# Patient Record
Sex: Female | Born: 1965
Health system: Southern US, Community
[De-identification: ages and names within clinical notes are randomized; demographics above are authoritative.]

## PROBLEM LIST (undated history)

## (undated) DIAGNOSIS — D071 Carcinoma in situ of vulva: Secondary | ICD-10-CM

## (undated) DIAGNOSIS — E7800 Pure hypercholesterolemia, unspecified: Secondary | ICD-10-CM

## (undated) DIAGNOSIS — J00 Acute nasopharyngitis [common cold]: Secondary | ICD-10-CM

## (undated) DIAGNOSIS — D649 Anemia, unspecified: Secondary | ICD-10-CM

## (undated) DIAGNOSIS — F411 Generalized anxiety disorder: Secondary | ICD-10-CM

## (undated) DIAGNOSIS — K219 Gastro-esophageal reflux disease without esophagitis: Secondary | ICD-10-CM

## (undated) DIAGNOSIS — L570 Actinic keratosis: Secondary | ICD-10-CM

## (undated) DIAGNOSIS — F419 Anxiety disorder, unspecified: Secondary | ICD-10-CM

## (undated) DIAGNOSIS — Z872 Personal history of diseases of the skin and subcutaneous tissue: Secondary | ICD-10-CM

## (undated) DIAGNOSIS — Z86018 Personal history of other benign neoplasm: Secondary | ICD-10-CM

## (undated) DIAGNOSIS — I10 Essential (primary) hypertension: Secondary | ICD-10-CM

## (undated) HISTORY — DX: Actinic keratosis: L57.0

## (undated) HISTORY — DX: Anemia, unspecified: D64.9

## (undated) HISTORY — DX: Gastro-esophageal reflux disease without esophagitis: K21.9

## (undated) HISTORY — DX: Essential (primary) hypertension: I10

## (undated) HISTORY — DX: Anxiety disorder, unspecified: F41.9

## (undated) HISTORY — DX: Carcinoma in situ of vulva: D07.1

---

## 2005-08-06 HISTORY — PX: LAPAROSCOPIC ASSISTED VAGINAL HYSTERECTOMY: SHX5398

## 2016-08-06 HISTORY — PX: INCISE AND DRAIN ABCESS: PRO64

## 2017-02-15 ENCOUNTER — Encounter: Payer: Self-pay | Admitting: Family Medicine

## 2017-02-15 ENCOUNTER — Ambulatory Visit (INDEPENDENT_AMBULATORY_CARE_PROVIDER_SITE_OTHER): Payer: BLUE CROSS/BLUE SHIELD | Admitting: Family Medicine

## 2017-02-15 VITALS — BP 180/108 | HR 61 | Resp 20 | Ht 68.0 in | Wt 181.0 lb

## 2017-02-15 DIAGNOSIS — N9089 Other specified noninflammatory disorders of vulva and perineum: Secondary | ICD-10-CM | POA: Diagnosis not present

## 2017-02-15 DIAGNOSIS — F411 Generalized anxiety disorder: Secondary | ICD-10-CM | POA: Insufficient documentation

## 2017-02-15 DIAGNOSIS — Z01411 Encounter for gynecological examination (general) (routine) with abnormal findings: Secondary | ICD-10-CM

## 2017-02-15 DIAGNOSIS — L732 Hidradenitis suppurativa: Secondary | ICD-10-CM | POA: Insufficient documentation

## 2017-02-15 DIAGNOSIS — Z01419 Encounter for gynecological examination (general) (routine) without abnormal findings: Secondary | ICD-10-CM | POA: Diagnosis not present

## 2017-02-15 DIAGNOSIS — D071 Carcinoma in situ of vulva: Secondary | ICD-10-CM | POA: Insufficient documentation

## 2017-02-15 DIAGNOSIS — K219 Gastro-esophageal reflux disease without esophagitis: Secondary | ICD-10-CM | POA: Insufficient documentation

## 2017-02-15 DIAGNOSIS — I10 Essential (primary) hypertension: Secondary | ICD-10-CM | POA: Insufficient documentation

## 2017-02-15 DIAGNOSIS — Z720 Tobacco use: Secondary | ICD-10-CM | POA: Insufficient documentation

## 2017-02-15 MED ORDER — BUPROPION HCL 75 MG PO TABS: 75.0000 mg | ORAL_TABLET | Freq: Two times a day (BID) | ORAL | 2 refills | Status: DC

## 2017-02-15 MED ORDER — HYDROCHLOROTHIAZIDE 25 MG PO TABS: 25.0000 mg | ORAL_TABLET | Freq: Every day | ORAL | 3 refills | Status: DC

## 2017-02-15 MED ORDER — CHLORHEXIDINE GLUCONATE 4 % EX LIQD: Freq: Every day | CUTANEOUS | 0 refills | Status: DC | PRN

## 2017-02-15 NOTE — Assessment & Plan Note (Signed)
S/p biopsy today--f/u and treat based on results

## 2017-02-15 NOTE — Assessment & Plan Note (Signed)
Begin Wellbutrin, usual side effects, treatment dosing and onset of action discussed at length.

## 2017-02-15 NOTE — Patient Instructions (Addendum)
Preventive Care 40-64 Years, Female Preventive care refers to lifestyle choices and visits with your health care provider that can promote health and wellness. What does preventive care include?  A yearly physical exam. This is also called an annual well check.  Dental exams once or twice a year.  Routine eye exams. Ask your health care provider how often you should have your eyes checked.  Personal lifestyle choices, including: ? Daily care of your teeth and gums. ? Regular physical activity. ? Eating a healthy diet. ? Avoiding tobacco and drug use. ? Limiting alcohol use. ? Practicing safe sex. ? Taking low-dose aspirin daily starting at age 58. ? Taking vitamin and mineral supplements as recommended by your health care provider. What happens during an annual well check? The services and screenings done by your health care provider during your annual well check will depend on your age, overall health, lifestyle risk factors, and family history of disease. Counseling Your health care provider may ask you questions about your:  Alcohol use.  Tobacco use.  Drug use.  Emotional well-being.  Home and relationship well-being.  Sexual activity.  Eating habits.  Work and work Statistician.  Method of birth control.  Menstrual cycle.  Pregnancy history.  Screening You may have the following tests or measurements:  Height, weight, and BMI.  Blood pressure.  Lipid and cholesterol levels. These may be checked every 5 years, or more frequently if you are over 81 years old.  Skin check.  Lung cancer screening. You may have this screening every year starting at age 78 if you have a 30-pack-year history of smoking and currently smoke or have quit within the past 15 years.  Fecal occult blood test (FOBT) of the stool. You may have this test every year starting at age 65.  Flexible sigmoidoscopy or colonoscopy. You may have a sigmoidoscopy every 5 years or a colonoscopy  every 10 years starting at age 30.  Hepatitis C blood test.  Hepatitis B blood test.  Sexually transmitted disease (STD) testing.  Diabetes screening. This is done by checking your blood sugar (glucose) after you have not eaten for a while (fasting). You may have this done every 1-3 years.  Mammogram. This may be done every 1-2 years. Talk to your health care provider about when you should start having regular mammograms. This may depend on whether you have a family history of breast cancer.  BRCA-related cancer screening. This may be done if you have a family history of breast, ovarian, tubal, or peritoneal cancers.  Pelvic exam and Pap test. This may be done every 3 years starting at age 80. Starting at age 36, this may be done every 5 years if you have a Pap test in combination with an HPV test.  Bone density scan. This is done to screen for osteoporosis. You may have this scan if you are at high risk for osteoporosis.  Discuss your test results, treatment options, and if necessary, the need for more tests with your health care provider. Vaccines Your health care provider may recommend certain vaccines, such as:  Influenza vaccine. This is recommended every year.  Tetanus, diphtheria, and acellular pertussis (Tdap, Td) vaccine. You may need a Td booster every 10 years.  Varicella vaccine. You may need this if you have not been vaccinated.  Zoster vaccine. You may need this after age 5.  Measles, mumps, and rubella (MMR) vaccine. You may need at least one dose of MMR if you were born in  1957 or later. You may also need a second dose.  Pneumococcal 13-valent conjugate (PCV13) vaccine. You may need this if you have certain conditions and were not previously vaccinated.  Pneumococcal polysaccharide (PPSV23) vaccine. You may need one or two doses if you smoke cigarettes or if you have certain conditions.  Meningococcal vaccine. You may need this if you have certain  conditions.  Hepatitis A vaccine. You may need this if you have certain conditions or if you travel or work in places where you may be exposed to hepatitis A.  Hepatitis B vaccine. You may need this if you have certain conditions or if you travel or work in places where you may be exposed to hepatitis B.  Haemophilus influenzae type b (Hib) vaccine. You may need this if you have certain conditions.  Talk to your health care provider about which screenings and vaccines you need and how often you need them. This information is not intended to replace advice given to you by your health care provider. Make sure you discuss any questions you have with your health care provider. Document Released: 08/19/2015 Document Revised: 04/11/2016 Document Reviewed: 05/24/2015 Elsevier Interactive Patient Education  2017 [employer]. Hidradenitis Suppurativa Hidradenitis suppurativa is a long-term (chronic) skin disease that starts with blocked sweat glands or hair follicles. Bacteria may grow in these blocked openings of your skin. Hidradenitis suppurativa is like a severe form of acne that develops in areas of your body where acne would be unusual. It is most likely to affect the areas of your body where skin rubs against skin and becomes moist. This includes your:  Underarms.  Groin.  Genital areas.  Buttocks.  Upper thighs.  Breasts.  Hidradenitis suppurativa may start out with small pimples. The pimples can develop into deep sores that break open (rupture) and drain pus. Over time your skin may thicken and become scarred. Hidradenitis suppurativa cannot be passed from person to person. What are the causes? The exact cause of hidradenitis suppurativa is not known. This condition may be due to:  Female and female hormones. The condition is rare before and after puberty.  An overactive body defense system (immune system). Your immune system may overreact to the blocked hair follicles or sweat  glands and cause swelling and pus-filled sores.  What increases the risk? You may have a higher risk of hidradenitis suppurativa if you:  Are a woman.  Are between ages 74 and 21.  Have a family history of hidradenitis suppurativa.  Have a personal history of acne.  Are overweight.  Smoke.  Take the drug lithium.  What are the signs or symptoms? The first signs of an outbreak are usually painful skin bumps that look like pimples. As the condition progresses:  Skin bumps may get bigger and grow deeper into the skin.  Bumps under the skin may rupture and drain smelly pus.  Skin may become itchy and infected.  Skin may thicken and scar.  Drainage may continue through tunnels under the skin (fistulas).  Walking and moving your arms can become painful.  How is this diagnosed? Your health care provider may diagnose hidradenitis suppurativa based on your medical history and your signs and symptoms. A physical exam will also be done. You may need to see a health care provider who specializes in skin diseases (dermatologist). You may also have tests done to confirm the diagnosis. These can include:  Swabbing a sample of pus or drainage from your skin so it can be sent to  the lab and tested for infection.  Blood tests to check for infection.  How is this treated? The same treatment will not work for everybody with hidradenitis suppurativa. Your treatment will depend on how severe your symptoms are. You may need to try several treatments to find what works best for you. Part of your treatment may include cleaning and bandaging (dressing) your wounds. You may also have to take medicines, such as the following:  Antibiotics.  Acne medicines.  Medicines to block or suppress the immune system.  A diabetes medicine (metformin) is sometimes used to treat this condition.  For women, birth control pills can sometimes help relieve symptoms.  You may need surgery if you have a severe  case of hidradenitis suppurativa that does not respond to medicine. Surgery may involve:  Using a laser to clear the skin and remove hair follicles.  Opening and draining deep sores.  Removing the areas of skin that are diseased and scarred.  Follow these instructions at home:  Learn as much as you can about your disease, and work closely with your health care providers.  Take medicines only as directed by your health care provider.  If you were prescribed an antibiotic medicine, finish it all even if you start to feel better.  If you are overweight, losing weight may be very helpful. Try to reach and maintain a healthy weight.  Do not use any tobacco products, including cigarettes, chewing tobacco, or electronic cigarettes. If you need help quitting, ask your health care provider.  Do not shave the areas where you get hidradenitis suppurativa.  Do not wear deodorant.  Wear loose-fitting clothes.  Try not to overheat and get sweaty.  Take a daily bleach bath as directed by your health care provider. ? Fill your bathtub halfway with water. ? Pour in  cup of unscented household bleach. ? Soak for 5-10 minutes.  Cover sore areas with a warm, clean washcloth (compress) for 5-10 minutes. Contact a health care provider if:  You have a flare-up of hidradenitis suppurativa.  You have chills or a fever.  You are having trouble controlling your symptoms at home. This information is not intended to replace advice given to you by your health care provider. Make sure you discuss any questions you have with your health care provider. Document Released: 03/06/2004 Document Revised: 12/29/2015 Document Reviewed: 10/23/2013 Elsevier Interactive Patient Education  2018 [employer].  Steps to Quit Smoking Smoking tobacco can be bad for your health. It can also affect almost every organ in your body. Smoking puts you and people around you at risk for many serious long-lasting (chronic)  diseases. Quitting smoking is hard, but it is one of the best things that you can do for your health. It is never too late to quit. What are the benefits of quitting smoking? When you quit smoking, you lower your risk for getting serious diseases and conditions. They can include:  Lung cancer or lung disease.  Heart disease.  Stroke.  Heart attack.  Not being able to have children (infertility).  Weak bones (osteoporosis) and broken bones (fractures).  If you have coughing, wheezing, and shortness of breath, those symptoms may get better when you quit. You may also get sick less often. If you are pregnant, quitting smoking can help to lower your chances of having a baby of low birth weight. What can I do to help me quit smoking? Talk with your doctor about what can help you quit smoking. Some things  you can do (strategies) include:  Quitting smoking totally, instead of slowly cutting back how much you smoke over a period of time.  Going to in-person counseling. You are more likely to quit if you go to many counseling sessions.  Using resources and support systems, such as: ? Database administrator with a Social worker. ? Phone quitlines. ? Careers information officer. ? Support groups or group counseling. ? Text messaging programs. ? Mobile phone apps or applications.  Taking medicines. Some of these medicines may have nicotine in them. If you are pregnant or breastfeeding, do not take any medicines to quit smoking unless your doctor says it is okay. Talk with your doctor about counseling or other things that can help you.  Talk with your doctor about using more than one strategy at the same time, such as taking medicines while you are also going to in-person counseling. This can help make quitting easier. What things can I do to make it easier to quit? Quitting smoking might feel very hard at first, but there is a lot that you can do to make it easier. Take these steps:  Talk to your family  and friends. Ask them to support and encourage you.  Call phone quitlines, reach out to support groups, or work with a Social worker.  Ask people who smoke to not smoke around you.  Avoid places that make you want (trigger) to smoke, such as: ? Bars. ? Parties. ? Smoke-break areas at work.  Spend time with people who do not smoke.  Lower the stress in your life. Stress can make you want to smoke. Try these things to help your stress: ? Getting regular exercise. ? Deep-breathing exercises. ? Yoga. ? Meditating. ? Doing a body scan. To do this, close your eyes, focus on one area of your body at a time from head to toe, and notice which parts of your body are tense. Try to relax the muscles in those areas.  Download or buy apps on your mobile phone or tablet that can help you stick to your quit plan. There are many free apps, such as QuitGuide from the [employer] Office manager for Disease Control and Prevention). You can find more support from smokefree.gov and other websites.  This information is not intended to replace advice given to you by your health care provider. Make sure you discuss any questions you have with your health care provider. Document Released: 05/19/2009 Document Revised: 03/20/2016 Document Reviewed: 12/07/2014 Elsevier Interactive Patient Education  2018 [place] DASH stands for "Dietary Approaches to Stop Hypertension." The DASH eating plan is a healthy eating plan that has been shown to reduce high blood pressure (hypertension). It may also reduce your risk for type 2 diabetes, heart disease, and stroke. The DASH eating plan may also help with weight loss. What are tips for following this plan? General guidelines Avoid eating more than 2,300 mg (milligrams) of salt (sodium) a day. If you have hypertension, you may need to reduce your sodium intake to 1,500 mg a day. Limit alcohol intake to no more than 1 drink a day for nonpregnant women and 2 drinks a day  for men. One drink equals 12 oz of beer, 5 oz of wine, or 1 oz of hard liquor. Work with your health care provider to maintain a healthy body weight or to lose weight. Ask what an ideal weight is for you. Get at least 30 minutes of exercise that causes your heart to beat faster (aerobic  exercise) most days of the week. Activities may include walking, swimming, or biking. Work with your health care provider or diet and nutrition specialist (dietitian) to adjust your eating plan to your individual calorie needs. Reading food labels Check food labels for the amount of sodium per serving. Choose foods with less than 5 percent of the Daily Value of sodium. Generally, foods with less than 300 mg of sodium per serving fit into this eating plan. To find whole grains, look for the word "whole" as the first word in the ingredient list. Shopping Buy products labeled as "low-sodium" or "no salt added." Buy fresh foods. Avoid canned foods and premade or frozen meals. Cooking Avoid adding salt when cooking. Use salt-free seasonings or herbs instead of table salt or sea salt. Check with your health care provider or pharmacist before using salt substitutes. Do not fry foods. Cook foods using healthy methods such as baking, boiling, grilling, and broiling instead. Cook with heart-healthy oils, such as olive, canola, soybean, or sunflower oil. Meal planning  Eat a balanced diet that includes: 5 or more servings of fruits and vegetables each day. At each meal, try to fill half of your plate with fruits and vegetables. Up to 6-8 servings of whole grains each day. Less than 6 oz of lean meat, poultry, or fish each day. A 3-oz serving of meat is about the same size as a deck of cards. One egg equals 1 oz. 2 servings of low-fat dairy each day. A serving of nuts, seeds, or beans 5 times each week. Heart-healthy fats. Healthy fats called Omega-3 fatty acids are found in foods such as flaxseeds and coldwater fish, like  sardines, salmon, and mackerel. Limit how much you eat of the following: Canned or prepackaged foods. Food that is high in trans fat, such as fried foods. Food that is high in saturated fat, such as fatty meat. Sweets, desserts, sugary drinks, and other foods with added sugar. Full-fat dairy products. Do not salt foods before eating. Try to eat at least 2 vegetarian meals each week. Eat more home-cooked food and less restaurant, buffet, and fast food. When eating at a restaurant, ask that your food be prepared with less salt or no salt, if possible. What foods are recommended? The items listed may not be a complete list. Talk with your dietitian about what dietary choices are best for you. Grains Whole-grain or whole-wheat bread. Whole-grain or whole-wheat pasta. Brown rice. [place]. Bulgur. Whole-grain and low-sodium cereals. Pita bread. Low-fat, low-sodium crackers. Whole-wheat flour tortillas. Vegetables Fresh or frozen vegetables (raw, steamed, roasted, or grilled). Low-sodium or reduced-sodium tomato and vegetable juice. Low-sodium or reduced-sodium tomato sauce and tomato paste. Low-sodium or reduced-sodium canned vegetables. Fruits All fresh, dried, or frozen fruit. Canned fruit in natural juice (without added sugar). Meat and other protein foods Skinless chicken or [place]. Ground chicken or [place]. Pork with fat trimmed off. Fish and seafood. Egg whites. Dried beans, peas, or lentils. Unsalted nuts, nut butters, and seeds. Unsalted canned beans. Lean cuts of beef with fat trimmed off. Low-sodium, lean deli meat. Dairy Low-fat (1%) or fat-free (skim) milk. Fat-free, low-fat, or reduced-fat cheeses. Nonfat, low-sodium ricotta or cottage cheese. Low-fat or nonfat yogurt. Low-fat, low-sodium cheese. Fats and oils Soft margarine without trans fats. Vegetable oil. Low-fat, reduced-fat, or light mayonnaise and salad dressings (reduced-sodium). Canola, safflower, olive, soybean, and  sunflower oils. Avocado. Seasoning and other foods Herbs. Spices. Seasoning mixes without salt. Unsalted popcorn and pretzels. Fat-free sweets. What foods are  not recommended? The items listed may not be a complete list. Talk with your dietitian about what dietary choices are best for you. Grains Baked goods made with fat, such as croissants, muffins, or some breads. Dry pasta or rice meal packs. Vegetables Creamed or fried vegetables. Vegetables in a cheese sauce. Regular canned vegetables (not low-sodium or reduced-sodium). Regular canned tomato sauce and paste (not low-sodium or reduced-sodium). Regular tomato and vegetable juice (not low-sodium or reduced-sodium). Angie Fava. Olives. Fruits Canned fruit in a light or heavy syrup. Fried fruit. Fruit in cream or butter sauce. Meat and other protein foods Fatty cuts of meat. Ribs. Fried meat. Berniece Salines. Sausage. Bologna and other processed lunch meats. Salami. Fatback. Hotdogs. Bratwurst. Salted nuts and seeds. Canned beans with added salt. Canned or smoked fish. Whole eggs or egg yolks. Chicken or [place] with skin. Dairy Whole or 2% milk, cream, and half-and-half. Whole or full-fat cream cheese. Whole-fat or sweetened yogurt. Full-fat cheese. Nondairy creamers. Whipped toppings. Processed cheese and cheese spreads. Fats and oils Butter. Stick margarine. Lard. Shortening. Ghee. Bacon fat. Tropical oils, such as coconut, palm kernel, or palm oil. Seasoning and other foods Salted popcorn and pretzels. Onion salt, garlic salt, seasoned salt, table salt, and sea salt. Worcestershire sauce. Tartar sauce. Barbecue sauce. Teriyaki sauce. Soy sauce, including reduced-sodium. Steak sauce. Canned and packaged gravies. Fish sauce. Oyster sauce. Cocktail sauce. Horseradish that you find on the shelf. Ketchup. Mustard. Meat flavorings and tenderizers. Bouillon cubes. Hot sauce and Tabasco sauce. Premade or packaged marinades. Premade or packaged taco seasonings.  Relishes. Regular salad dressings. Where to find more information: National Heart, Lung, and [place]: https://wilson-eaton.com/ American Heart Association: www.heart.org Summary The DASH eating plan is a healthy eating plan that has been shown to reduce high blood pressure (hypertension). It may also reduce your risk for type 2 diabetes, heart disease, and stroke. With the DASH eating plan, you should limit salt (sodium) intake to 2,300 mg a day. If you have hypertension, you may need to reduce your sodium intake to 1,500 mg a day. When on the DASH eating plan, aim to eat more fresh fruits and vegetables, whole grains, lean proteins, low-fat dairy, and heart-healthy fats. Work with your health care provider or diet and nutrition specialist (dietitian) to adjust your eating plan to your individual calorie needs. This information is not intended to replace advice given to you by your health care provider. Make sure you discuss any questions you have with your health care provider. Document Released: 07/12/2011 Document Revised: 07/16/2016 Document Reviewed: 07/16/2016 Elsevier Interactive Patient Education  2017 [employer].

## 2017-02-15 NOTE — Assessment & Plan Note (Addendum)
Given how high her BP is will add HCTZ--see PCP--dash diet given

## 2017-02-15 NOTE — Assessment & Plan Note (Signed)
Associated with a lot of bloating--has seen GI in past, but suspect this will need further w/u and needs screening colonoscopy

## 2017-02-15 NOTE — Assessment & Plan Note (Addendum)
Given Rx for hibiclens to use weekly-->daily to try to decrease outbreaks, stop smoking, lose weight.

## 2017-02-15 NOTE — Progress Notes (Signed)
Pt here today c/o vaginal itching and discomfort, has a history of suppurative hidradenitis.  Also c/o increased weight gain, hot flashes, sleep disturbances, and abdominal bloating.

## 2017-02-15 NOTE — Assessment & Plan Note (Signed)
Given Wellbutrin and discussed briefly smoking and impact on health, BP, and hidradenitis

## 2017-02-15 NOTE — Progress Notes (Signed)
Subjective:     Beth Green is a 51 y.o. female and is here for a comprehensive physical exam. The patient reports problems - multiple. Feels bloated all the time. Has GERD and has Zantac. Tried prilosec which helped the GERD but not the bloating. Notes some weight gain. 35 lbs in last four years. Has hot flashes in last 2-3 years. Insomnia -- takes benadryl type med and sleeps x 4 hours. Then has difficulty falling asleep if takes nothing. Also has trouble turning off thoughts if awakens. Has hot flashes at night, some vaginal irritation which keeps her up. She has h/o hidradenitis and never has been treated. Has some white itchy patches that wakes her and is extremely itchy. Tried vagisil which made it worse. Used petroleum jelly and witch hazel.   Social History   Social History  . Marital status: Married    Spouse name: N/A  . Number of children: N/A  . Years of education: N/A   Occupational History  . Not on file.   Social History Main Topics  . Smoking status: Current Every Day Smoker    Packs/day: 0.25    Types: Cigarettes  . Smokeless tobacco: Never Used  . Alcohol use 6.0 oz/week    10 Glasses of wine per week  . Drug use: Unknown  . Sexual activity: Yes    Birth control/ protection: Surgical     Comment: Hysterectomy   Other Topics Concern  . Not on file   Social History Narrative  . No narrative on file   Health Maintenance  Topic Date Due  . HIV Screening  10/06/1980  . TETANUS/TDAP  10/06/1984  . PAP SMEAR  10/07/1986  . MAMMOGRAM  10/07/2015  . COLONOSCOPY  10/07/2015  . INFLUENZA VACCINE  03/06/2017    The following portions of the patient's history were reviewed and updated as appropriate: allergies, current medications, past family history, past medical history, past social history, past surgical history and problem list.  Review of Systems Pertinent items noted in HPI and remainder of comprehensive ROS otherwise negative.   Objective:    BP (!)  180/108 Comment: recheck left arm  Pulse 61   Resp 20   Ht 5\' 8"  (1.727 m)   Wt 181 lb (82.1 kg)   BMI 27.52 kg/m  General appearance: alert, cooperative and appears stated age Head: Normocephalic, without obvious abnormality, atraumatic Neck: no adenopathy, supple, symmetrical, trachea midline and thyroid not enlarged, symmetric, no tenderness/mass/nodules Lungs: clear to auscultation bilaterally Breasts: normal appearance, no masses or tenderness Heart: regular rate and rhythm, S1, S2 normal, no murmur, click, rub or gallop Abdomen: soft, non-tender; bowel sounds normal; no masses,  no organomegaly Pelvic: cervix normal in appearance, no adnexal masses or tenderness, no cervical motion tenderness, uterus normal size, shape, and consistency, vagina normal without discharge and mulitple boils in various stages noted. right labia minora with pink/white plaque noted Extremities: extremities normal, atraumatic, no cyanosis or edema Pulses: 2+ and symmetric Skin: Skin color, texture, turgor normal. No rashes or lesions Lymph nodes: Cervical, supraclavicular, and axillary nodes normal. Neurologic: Grossly normal    Procedure: Patient identified, informed consent signed, copy in chart, time out performed.   Area cleansed with Alcohol.  Injected with 1% Lidocaine with Epi.  2 mL. Area cleaned with Betadine and 4 mm punch biopsy performed without difficulty.  Hemostasis obtained with Silver Nitrate.  Assessment/Plan:   Problem List Items Addressed This Visit      Unprioritized   Suppurative  hidradenitis    Given Rx for hibiclens to use weekly-->daily to try to decrease outbreaks, stop smoking, lose weight.      Relevant Medications   chlorhexidine (HIBICLENS) 4 % external liquid   GERD (gastroesophageal reflux disease)    Associated with a lot of bloating--has seen GI in past, but suspect this will need further w/u and needs screening colonoscopy      Relevant Medications    ranitidine (ZANTAC) 150 MG tablet   Other Relevant Orders   Ambulatory referral to Gastroenterology   Tobacco abuse    Given Wellbutrin and discussed briefly smoking and impact on health, BP, and hidradenitis      Generalized anxiety disorder    Begin Wellbutrin, usual side effects, treatment dosing and onset of action discussed at length.      Relevant Medications   buPROPion (WELLBUTRIN) 75 MG tablet   Benign essential hypertension    Given how high her BP is will add HCTZ--see PCP--dash diet given      Relevant Medications   hydrochlorothiazide (HYDRODIURIL) 25 MG tablet   Other Relevant Orders   Ambulatory referral to Internal Medicine   Vulvar irritation    S/p biopsy today--f/u and treat based on results      Relevant Orders   Surgical pathology    Other Visit Diagnoses    Encounter for gynecological examination with abnormal finding    -  Primary   Relevant Orders   MM DIGITAL SCREENING BILATERAL   CBC   TSH   Comprehensive metabolic panel   Lipid panel        See After Visit Summary for Counseling Recommendations

## 2017-02-16 LAB — COMPREHENSIVE METABOLIC PANEL
ALT: 24 IU/L (ref 0–32)
AST: 19 IU/L (ref 0–40)
Albumin/Globulin Ratio: 2 (ref 1.2–2.2)
Albumin: 4.7 g/dL (ref 3.5–5.5)
Alkaline Phosphatase: 81 IU/L (ref 39–117)
BUN/Creatinine Ratio: 17 (ref 9–23)
BUN: 10 mg/dL (ref 6–24)
Bilirubin Total: 0.4 mg/dL (ref 0.0–1.2)
CO2: 22 mmol/L (ref 20–29)
Calcium: 9.7 mg/dL (ref 8.7–10.2)
Chloride: 103 mmol/L (ref 96–106)
Creatinine, Ser: 0.6 mg/dL (ref 0.57–1.00)
GFR calc Af Amer: 122 mL/min/{1.73_m2} (ref >59)
GFR calc non Af Amer: 106 mL/min/{1.73_m2} (ref >59)
Globulin, Total: 2.3 g/dL (ref 1.5–4.5)
Glucose: 93 mg/dL (ref 65–99)
Potassium: 4.4 mmol/L (ref 3.5–5.2)
Sodium: 144 mmol/L (ref 134–144)
Total Protein: 7 g/dL (ref 6.0–8.5)

## 2017-02-16 LAB — LIPID PANEL
Chol/HDL Ratio: 4.9 ratio — ABNORMAL HIGH (ref 0.0–4.4)
Cholesterol, Total: 258 mg/dL — ABNORMAL HIGH (ref 100–199)
HDL: 53 mg/dL (ref >39)
LDL Calculated: 141 mg/dL — ABNORMAL HIGH (ref 0–99)
Triglycerides: 320 mg/dL — ABNORMAL HIGH (ref 0–149)
VLDL Cholesterol Cal: 64 mg/dL — ABNORMAL HIGH (ref 5–40)

## 2017-02-16 LAB — CBC
Hematocrit: 44 % (ref 34.0–46.6)
Hemoglobin: 14.8 g/dL (ref 11.1–15.9)
MCH: 30.8 pg (ref 26.6–33.0)
MCHC: 33.6 g/dL (ref 31.5–35.7)
MCV: 92 fL (ref 79–97)
Platelets: 153 10*3/uL (ref 150–379)
RBC: 4.81 x10E6/uL (ref 3.77–5.28)
RDW: 13.2 % (ref 12.3–15.4)
WBC: 7.6 10*3/uL (ref 3.4–10.8)

## 2017-02-16 LAB — TSH: TSH: 0.962 u[IU]/mL (ref 0.450–4.500)

## 2017-02-20 ENCOUNTER — Encounter: Payer: Self-pay | Admitting: Gastroenterology

## 2017-03-08 ENCOUNTER — Encounter: Payer: Self-pay | Admitting: Family Medicine

## 2017-03-08 ENCOUNTER — Ambulatory Visit (INDEPENDENT_AMBULATORY_CARE_PROVIDER_SITE_OTHER): Payer: BLUE CROSS/BLUE SHIELD | Admitting: Family Medicine

## 2017-03-08 DIAGNOSIS — D071 Carcinoma in situ of vulva: Secondary | ICD-10-CM

## 2017-03-08 NOTE — Assessment & Plan Note (Addendum)
Discussed implications of this. Treatment needed with Wide local excision--Risks include but are not limited to bleeding, infection, injury to surrounding structures..  Likelihood of success is high.

## 2017-03-08 NOTE — Progress Notes (Signed)
   Subjective:    Patient ID: Beth Green is a 51 y.o. female presenting with Follow-up  on 03/08/2017  HPI: Here today for f/u vulvar biopsy. Results reveal VIN3.   Review of Systems  Constitutional: Negative for chills and fever.  Respiratory: Negative for shortness of breath.   Cardiovascular: Negative for chest pain.  Gastrointestinal: Negative for abdominal pain, nausea and vomiting.  Genitourinary: Positive for genital sores (vulvar itching). Negative for dysuria.  Skin: Negative for rash.      Objective:    BP (!) 153/91   Pulse 91   Wt 180 lb (81.6 kg)   BMI 27.37 kg/m  Physical Exam  Constitutional: She is oriented to person, place, and time. She appears well-developed and well-nourished. No distress.  HENT:  Head: Normocephalic and atraumatic.  Eyes: No scleral icterus.  Neck: Neck supple.  Cardiovascular: Normal rate.   Pulmonary/Chest: Effort normal.  Abdominal: Soft.  Neurological: She is alert and oriented to person, place, and time.  Skin: Skin is warm and dry.  Psychiatric: She has a normal mood and affect.        Assessment & Plan:   Problem List Items Addressed This Visit      Unprioritized   Vulvar intraepithelial neoplasia (VIN) grade 3    Discussed implications of this. Treatment needed with Wide local excision--Risks include but are not limited to bleeding, infection, injury to surrounding structures..  Likelihood of success is high.          Total face-to-face time with patient: 25 minutes. Over 50% of encounter was spent on counseling and coordination of care. Return in about 3 months (around 06/08/2017) for postop check.  Donnamae Jude 03/08/2017 10:14 AM

## 2017-03-11 ENCOUNTER — Other Ambulatory Visit: Payer: Self-pay | Admitting: *Deleted

## 2017-03-11 ENCOUNTER — Encounter (HOSPITAL_COMMUNITY): Payer: Self-pay

## 2017-03-11 ENCOUNTER — Encounter: Payer: Self-pay | Admitting: *Deleted

## 2017-03-11 DIAGNOSIS — Z01411 Encounter for gynecological examination (general) (routine) with abnormal findings: Secondary | ICD-10-CM

## 2017-03-19 ENCOUNTER — Ambulatory Visit (INDEPENDENT_AMBULATORY_CARE_PROVIDER_SITE_OTHER)
Admission: RE | Admit: 2017-03-19 | Discharge: 2017-03-19 | Disposition: A | Discharge status: Home / Self Care | Payer: BLUE CROSS/BLUE SHIELD | Source: Ambulatory Visit | Attending: Internal Medicine | Admitting: Internal Medicine

## 2017-03-19 ENCOUNTER — Encounter: Payer: Self-pay | Admitting: Internal Medicine

## 2017-03-19 ENCOUNTER — Ambulatory Visit (INDEPENDENT_AMBULATORY_CARE_PROVIDER_SITE_OTHER): Payer: BLUE CROSS/BLUE SHIELD | Admitting: Internal Medicine

## 2017-03-19 VITALS — BP 142/98 | HR 81 | Temp 98.1°F | Ht 68.0 in | Wt 181.5 lb

## 2017-03-19 DIAGNOSIS — W0110XA Fall on same level from slipping, tripping and stumbling with subsequent striking against unspecified object, initial encounter: Secondary | ICD-10-CM

## 2017-03-19 DIAGNOSIS — M79642 Pain in left hand: Secondary | ICD-10-CM | POA: Diagnosis not present

## 2017-03-19 DIAGNOSIS — K219 Gastro-esophageal reflux disease without esophagitis: Secondary | ICD-10-CM | POA: Diagnosis not present

## 2017-03-19 DIAGNOSIS — D071 Carcinoma in situ of vulva: Secondary | ICD-10-CM

## 2017-03-19 DIAGNOSIS — F411 Generalized anxiety disorder: Secondary | ICD-10-CM | POA: Diagnosis not present

## 2017-03-19 DIAGNOSIS — M25532 Pain in left wrist: Secondary | ICD-10-CM

## 2017-03-19 DIAGNOSIS — I10 Essential (primary) hypertension: Secondary | ICD-10-CM | POA: Diagnosis not present

## 2017-03-19 MED ORDER — LISINOPRIL-HYDROCHLOROTHIAZIDE 10-12.5 MG PO TABS: 1.0000 | ORAL_TABLET | Freq: Every day | ORAL | 0 refills | Status: DC

## 2017-03-19 NOTE — Assessment & Plan Note (Signed)
Stable on Wellbutrin Will monitor

## 2017-03-19 NOTE — Assessment & Plan Note (Signed)
She will continue to follow with GYN 

## 2017-03-19 NOTE — Assessment & Plan Note (Signed)
Uncontrolled on HCTZ, will d/c eRx for Lisinopril HCT 10-12.5 mg Discussed DASH diet and exercise for weight loss

## 2017-03-19 NOTE — Progress Notes (Signed)
HPI  Pt presents to the clinic today to establish care and for management of the conditions listed below. She has not had a PCP in many years.  Flu: never Tetanus: 08/2016 Pap Smear: > 5 years ago, partial hysterectomy Mammogram: 03/2017 Colon Screening: never Vision Screening: as needed Dentist: as needed  GERD: She is not sure what triggers this. She takes Zantac as needed with good relief. She tried Prilosec but she reports it messed with her up. She sees Dr. Fuller Plan next month.  Hidradenitis: She bathes with Chlorhexadine as needed.  HTN: Her BP today is 142/98. She is taking HCTZ as prescribed. There is no ECG on file.   VIN 3: Planned to have this removed next monty by gyn. Previous partial hysterectomy.   Anxiety: She was recently started on Wellbutrin 1 month ago by GYN. She does feel like it is helping. She denies depression, SI/Hi.  She also c/o left wrist and hand pain. This started 2 days ago after a fall that occurred at [employer]. She was walking into the store, when she tripped on the curb. She reports swelling and bruising to her left wrist and hand. She has tried ice and ACE wrap with minimal relief.  Past Medical History:  Diagnosis Date  . Fibroids   . GERD (gastroesophageal reflux disease)   . Suppurative hidradenitis     Current Outpatient Prescriptions  Medication Sig Dispense Refill  . buPROPion (WELLBUTRIN) 75 MG tablet Take 1 tablet (75 mg total) by mouth 2 (two) times daily. 1/2 tab daily x 5 d, 1/2 tab bid x 5 days then 1 bid 60 tablet 2  . chlorhexidine (HIBICLENS) 4 % external liquid Apply topically daily as needed. 120 mL 0  . diphenhydrAMINE (BENADRYL) 50 MG capsule Take 50 mg by mouth as needed for sleep.    . hydrochlorothiazide (HYDRODIURIL) 25 MG tablet Take 1 tablet (25 mg total) by mouth daily. 30 tablet 3  . ranitidine (ZANTAC) 150 MG tablet Take 150 mg by mouth as needed for heartburn.     No current facility-administered medications for this  visit.     No Known Allergies  Family History  Problem Relation Age of Onset  . Hypertension Paternal Grandfather   . Diabetes Father     Social History   Social History  . Marital status: Married    Spouse name: N/A  . Number of children: N/A  . Years of education: N/A   Occupational History  . Not on file.   Social History Main Topics  . Smoking status: Current Every Day Smoker    Packs/day: 0.25    Types: Cigarettes  . Smokeless tobacco: Never Used  . Alcohol use 6.0 oz/week    10 Glasses of wine per week  . Drug use: Unknown  . Sexual activity: Yes    Birth control/ protection: Surgical     Comment: Hysterectomy   Other Topics Concern  . Not on file   Social History Narrative  . No narrative on file    ROS:  Constitutional: Denies fever, malaise, fatigue, headache or abrupt weight changes.  HEENT: Denies eye pain, eye redness, ear pain, ringing in the ears, wax buildup, runny nose, nasal congestion, bloody nose, or sore throat. Respiratory: Denies difficulty breathing, shortness of breath, cough or sputum production.   Cardiovascular: Denies chest pain, chest tightness, palpitations or swelling in the hands or feet.  Gastrointestinal: Denies abdominal pain, bloating, constipation, diarrhea or blood in the stool.  GU:  Denies frequency, urgency, pain with urination, blood in urine, odor or discharge. Musculoskeletal: Pt reports left wrist pain and swelling. Denies decrease in range of motion, difficulty with gait, muscle pain.  Skin: Pt reports bruising of left wrist/hand. Denies redness, rashes, lesions or ulcercations.  Neurological: Denies dizziness, difficulty with memory, difficulty with speech or problems with balance and coordination.  Psych: Pt reports history of anxiety. Denies depression, SI/HI.  No other specific complaints in a complete review of systems (except as listed in HPI above).  PE:  BP (!) 142/98   Pulse 81   Temp 98.1 F (36.7 C)  (Oral)   Ht 5\' 8"  (1.727 m)   Wt 181 lb 8 oz (82.3 kg)   SpO2 98%   BMI 27.60 kg/m   Wt Readings from Last 3 Encounters:  03/08/17 180 lb (81.6 kg)  02/15/17 181 lb (82.1 kg)    General: Appears her stated age, well developed, well nourished in NAD. Skin: Bruising noted over palm, 4th and 5th metacarpals and anterior wrist. Cardiovascular: Normal rate and rhythm. S1,S2 noted.  No murmur, rubs or gallops noted. No JVD or BLE edema.  Pulmonary/Chest: Normal effort and positive vesicular breath sounds. No respiratory distress. No wheezes, rales or ronchi noted.  Abdomen: Soft, nontender. Musculoskeletal: Normal flexion, extension and rotation of the left wrist. No swelling noted. No pain with palpation. Hand grips slightly weaker L>R. Neurological: Alert and oriented. Sensation intact to BUE. Psychiatric: Mood and affect normal. Behavior is normal. Judgment and thought content normal.    BMET    Component Value Date/Time   NA 144 02/15/2017 1158   K 4.4 02/15/2017 1158   CL 103 02/15/2017 1158   CO2 22 02/15/2017 1158   GLUCOSE 93 02/15/2017 1158   BUN 10 02/15/2017 1158   CREATININE 0.60 02/15/2017 1158   CALCIUM 9.7 02/15/2017 1158   GFRNONAA 106 02/15/2017 1158   GFRAA 122 02/15/2017 1158    Lipid Panel     Component Value Date/Time   CHOL 258 (H) 02/15/2017 1158   TRIG 320 (H) 02/15/2017 1158   HDL 53 02/15/2017 1158   CHOLHDL 4.9 (H) 02/15/2017 1158   LDLCALC 141 (H) 02/15/2017 1158    CBC    Component Value Date/Time   WBC 7.6 02/15/2017 1158   RBC 4.81 02/15/2017 1158   HGB 14.8 02/15/2017 1158   HCT 44.0 02/15/2017 1158   PLT 153 02/15/2017 1158   MCV 92 02/15/2017 1158   MCH 30.8 02/15/2017 1158   MCHC 33.6 02/15/2017 1158   RDW 13.2 02/15/2017 1158    Hgb A1C No results found for: HGBA1C   Assessment and Plan:  Left Wrist/Hand Pain s/p Fall:  Will obtain xray of left wrist and hand Continue ice and ACE wrap Discussed NSAID use  Will  follow up after xray, RTC in 3 weeks, follow up HTN Beth Theroux, NP

## 2017-03-19 NOTE — Patient Instructions (Signed)
Elastic Bandage and RICE What does an elastic bandage do? Elastic bandages come in different shapes and sizes. They generally provide support to your injury and reduce swelling while you are healing, but they can perform different functions. Your health care provider will help you to decide what is best for your protection, recovery, or rehabilitation following an injury. What are some general tips for using an elastic bandage?  Use the bandage as directed by the maker of the bandage that you are using.  Do not wrap the bandage too tightly. This may cut off the circulation in the arm or leg in the area below the bandage. ? If part of your body beyond the bandage becomes blue, numb, cold, swollen, or is more painful, your bandage is most likely too tight. If this occurs, remove your bandage and reapply it more loosely.  See your health care provider if the bandage seems to be making your problems worse rather than better.  An elastic bandage should be removed and reapplied every 3-4 hours or as directed by your health care provider. What is RICE? The routine care of many injuries includes rest, ice, compression, and elevation (RICE therapy). Rest Rest is required to allow your body to heal. Generally, you can resume your routine activities when you are comfortable and have been given permission by your health care provider. Ice Icing your injury helps to keep the swelling down and it reduces pain. Do not apply ice directly to your skin.  Put ice in a plastic bag.  Place a towel between your skin and the bag.  Leave the ice on for 20 minutes, 2-3 times per day.  Do this for as long as you are directed by your health care provider. Compression Compression helps to keep swelling down, gives support, and helps with discomfort. Compression may be done with an elastic bandage. Elevation Elevation helps to reduce swelling and it decreases pain. If possible, your injured area should be placed at  or above the level of your heart or the center of your chest. When should I seek medical care? You should seek medical care if:  You have persistent pain and swelling.  Your symptoms are getting worse rather than improving.  These symptoms may indicate that further evaluation or further X-rays are needed. Sometimes, X-rays may not show a small broken bone (fracture) until a number of days later. Make a follow-up appointment with your health care provider. Ask when your X-ray results will be ready. Make sure that you get your X-ray results. When should I seek immediate medical care? You should seek immediate medical care if:  You have a sudden onset of severe pain at or below the area of your injury.  You develop redness or increased swelling around your injury.  You have tingling or numbness at or below the area of your injury that does not improve after you remove the elastic bandage.  This information is not intended to replace advice given to you by your health care provider. Make sure you discuss any questions you have with your health care provider. Document Released: 01/12/2002 Document Revised: 06/18/2016 Document Reviewed: 03/08/2014 Elsevier Interactive Patient Education  2017 [employer].

## 2017-03-19 NOTE — Assessment & Plan Note (Signed)
Continue Zantac as needed She will follow up with Dr.Stark as schedules

## 2017-04-11 ENCOUNTER — Telehealth: Payer: Self-pay | Admitting: Internal Medicine

## 2017-04-11 NOTE — Telephone Encounter (Signed)
[facility]  Patient Name: Beth Green  DOB: 1965-09-22    Initial Comment Caller states, injury in hand, 3 weeks later still painful, and felt pull in her hand. Tenderness.    Nurse Assessment  Nurse: Harlow Mares, RN, Suanne Marker Date/Time (Eastern Time): 04/11/2017 10:24:46 AM  Confirm and document reason for call. If symptomatic, describe symptoms. ---Caller states, injury in hand, 3 weeks later still painful, and felt pull in her hand. Tenderness. Reports that she had a fall on 8/14 and had Xrays. She did something to the hand yesterday that felt like a tugging and she is having more pain today.  Does the patient have any new or worsening symptoms? ---Yes  Will a triage be completed? ---Yes  Related visit to physician within the last 2 weeks? ---Yes  Does the PT have any chronic conditions? (i.e. diabetes, asthma, etc.) ---Yes  List chronic conditions. ---HTN; anxiety;  Is the patient pregnant or possibly pregnant? (Ask all females between the ages of 20-55) ---No  Is this a behavioral health or substance abuse call? ---No     Guidelines    Guideline Title Affirmed Question Affirmed Notes  Hand and Wrist Injury Can't use injured hand normally (e.g., make a fist, open fully, hold a glass of water)    Final Disposition User   See Physician within [place], RN, Suanne Marker    Comments  MD appt scheduled with [name] for tomorrow at 4:30pm at [facility].   Referrals  REFERRED TO PCP OFFICE   Disagree/Comply: Comply

## 2017-04-11 NOTE — Telephone Encounter (Signed)
Pt has appt with R Baity NP 04/12/17 at 4:30.

## 2017-04-11 NOTE — H&P (Signed)
Beth Green is an 51 y.o. [id number] female.   Chief Complaint: VIN 3  HPI: Patient with biopsy proven VIN3. Needs excision.  Past Medical History:  Diagnosis Date  . Anxiety   . Fibroids   . GERD (gastroesophageal reflux disease)   . Hypertension   . Suppurative hidradenitis   . Vulvar intraepithelial neoplasia (VIN) grade 3     Past Surgical History:  Procedure Laterality Date  . INCISE AND DRAIN ABCESS    . LAPAROSCOPIC ASSISTED VAGINAL HYSTERECTOMY    . WISDOM TOOTH EXTRACTION      Family History  Problem Relation Age of Onset  . Hypertension Paternal Grandfather   . Diabetes Father    Social History:  reports that she has been smoking Cigarettes.  She has been smoking about 0.25 packs per day. She has never used smokeless tobacco. She reports that she drinks about 6.0 oz of alcohol per week . She reports that she does not use drugs.  Allergies: No Known Allergies  No current facility-administered medications on file prior to encounter.    Current Outpatient Prescriptions on File Prior to Encounter  Medication Sig Dispense Refill  . buPROPion (WELLBUTRIN) 75 MG tablet Take 1 tablet (75 mg total) by mouth 2 (two) times daily. 1/2 tab daily x 5 d, 1/2 tab bid x 5 days then 1 bid 60 tablet 2  . chlorhexidine (HIBICLENS) 4 % external liquid Apply topically daily as needed. 120 mL 0  . diphenhydrAMINE (BENADRYL) 50 MG capsule Take 50 mg by mouth as needed for sleep.    . ranitidine (ZANTAC) 150 MG tablet Take 150 mg by mouth as needed for heartburn.      A comprehensive review of systems was negative.  There were no vitals taken for this visit. General appearance: alert, cooperative and appears stated age Head: Normocephalic, without obvious abnormality, atraumatic Neck: supple, symmetrical, trachea midline Lungs: normal effort Heart: regular rate and rhythm Abdomen: soft, non-tender; bowel sounds normal; no masses,  no organomegaly Extremities: extremities normal,  atraumatic, no cyanosis or edema Skin: vulvar lesion Neurologic: Grossly normal   Lab Results  Component Value Date   WBC 7.6 02/15/2017   HGB 14.8 02/15/2017   HCT 44.0 02/15/2017   MCV 92 02/15/2017   PLT 153 02/15/2017   No results found for: PREGTESTUR, PREGSERUM, HCG, HCGQUANT   Assessment/Plan Principal Problem:   Vulvar intraepithelial neoplasia (VIN) grade 3  For Wide local excision. Risks include but are not limited to bleeding, infection, injury to surrounding structures, blood clots, and death.  Likelihood of success is high.  Beth Green 04/11/2017, 8:06 AM

## 2017-04-12 ENCOUNTER — Ambulatory Visit (INDEPENDENT_AMBULATORY_CARE_PROVIDER_SITE_OTHER): Payer: BLUE CROSS/BLUE SHIELD | Admitting: Internal Medicine

## 2017-04-12 ENCOUNTER — Encounter (HOSPITAL_BASED_OUTPATIENT_CLINIC_OR_DEPARTMENT_OTHER): Payer: Self-pay | Admitting: *Deleted

## 2017-04-12 ENCOUNTER — Encounter: Payer: Self-pay | Admitting: Internal Medicine

## 2017-04-12 VITALS — BP 124/80 | HR 75 | Temp 97.9°F | Wt 180.0 lb

## 2017-04-12 DIAGNOSIS — I10 Essential (primary) hypertension: Secondary | ICD-10-CM

## 2017-04-12 DIAGNOSIS — M25532 Pain in left wrist: Secondary | ICD-10-CM | POA: Diagnosis not present

## 2017-04-12 MED ORDER — PREDNISONE 20 MG PO TABS: 20.0000 mg | ORAL_TABLET | Freq: Every day | ORAL | 0 refills | Status: DC

## 2017-04-12 MED ORDER — LISINOPRIL-HYDROCHLOROTHIAZIDE 10-12.5 MG PO TABS: 1.0000 | ORAL_TABLET | Freq: Every day | ORAL | 1 refills | Status: DC

## 2017-04-12 NOTE — Progress Notes (Signed)
Subjective:    Patient ID: Beth Green, female    DOB: 1966-05-19, 51 y.o.   MRN: [id number]  HPI  Pt presents to the clinic today with c/o left wrist pain. This started 3 weeks ago after tripping over a curb at [employer]. She was seen 8/14 for the same. Xray was negative for acute fracture. She was advised to ice the area, take Ibuprofen. The wrist was wrapped in an ACE wrap. Since that time, she reports her left wrist has continue to be sore. She moved her wrist yesterday, and felt a tugging sensation. Since that time, she reports increase in left wrist pain. The pain is worse with movement. She denies numbness, tingling or weakness.   Of note, at her last visit, her HCTZ was changed to Losartan HCT. She has been taking the medication as prescribed. She denies adverse side effects. Her BP today is 124/80.  Review of Systems      Past Medical History:  Diagnosis Date  . Anxiety   . GERD (gastroesophageal reflux disease)   . History of hidradenitis suppurativa   . History of uterine fibroid   . Hypertension   . Vulvar intraepithelial neoplasia (VIN) grade 3     Current Outpatient Prescriptions  Medication Sig Dispense Refill  . buPROPion (WELLBUTRIN) 75 MG tablet Take 1 tablet (75 mg total) by mouth 2 (two) times daily. 1/2 tab daily x 5 d, 1/2 tab bid x 5 days then 1 bid 60 tablet 2  . chlorhexidine (HIBICLENS) 4 % external liquid Apply topically daily as needed. 120 mL 0  . diphenhydrAMINE (BENADRYL) 50 MG capsule Take 50 mg by mouth as needed for sleep.    Marland Kitchen lisinopril-hydrochlorothiazide (PRINZIDE,ZESTORETIC) 10-12.5 MG tablet Take 1 tablet by mouth daily. 30 tablet 0  . ranitidine (ZANTAC) 150 MG tablet Take 150 mg by mouth as needed for heartburn.     No current facility-administered medications for this visit.     No Known Allergies  Family History  Problem Relation Age of Onset  . Hypertension Paternal Grandfather   . Diabetes Father     Social History   Social  History  . Marital status: Married    Spouse name: N/A  . Number of children: N/A  . Years of education: N/A   Occupational History  . Not on file.   Social History Main Topics  . Smoking status: Current Every Day Smoker    Packs/day: 0.25    Types: Cigarettes  . Smokeless tobacco: Never Used  . Alcohol use 6.0 oz/week    10 Glasses of wine per week     Comment: occasional  . Drug use: No  . Sexual activity: Yes    Birth control/ protection: Surgical     Comment: Hysterectomy   Other Topics Concern  . Not on file   Social History Narrative  . No narrative on file     Constitutional: Denies fever, malaise, fatigue, headache or abrupt weight changes.  Respiratory: Denies difficulty breathing, shortness of breath, cough or sputum production.   Cardiovascular: Denies chest pain, chest tightness, palpitations or swelling in the hands or feet.  Musculoskeletal: Pt reports left wrist pain. Denies difficulty with gait, muscle pain or joint swelling.  Neurological: Denies dizziness, difficulty with memory, difficulty with speech or problems with balance and coordination.    No other specific complaints in a complete review of systems (except as listed in HPI above).  Objective:   Physical Exam   BP  124/80   Pulse 75   Temp 97.9 F (36.6 C) (Oral)   Wt 180 lb (81.6 kg)   SpO2 98%   BMI 27.37 kg/m  Wt Readings from Last 3 Encounters:  04/12/17 180 lb (81.6 kg)  03/19/17 181 lb 8 oz (82.3 kg)  03/08/17 180 lb (81.6 kg)    General: Appears her stated age,  in NAD. Cardiovascular: Normal rate and rhythm.  Pulmonary/Chest: Normal effort and positive vesicular breath sounds. No respiratory distress. No wheezes, rales or ronchi noted.  Musculoskeletal: Normal flexion, extension of the left wrist. Pain with rotation. Pain with resistance to lateral flexion. Normal medial flexion. Pain with palpation of the ulnar aspect of the wrist. Hand grips equal. No swelling  noted. Neurological: Alert and oriented.    BMET    Component Value Date/Time   NA 144 02/15/2017 1158   K 4.4 02/15/2017 1158   CL 103 02/15/2017 1158   CO2 22 02/15/2017 1158   GLUCOSE 93 02/15/2017 1158   BUN 10 02/15/2017 1158   CREATININE 0.60 02/15/2017 1158   CALCIUM 9.7 02/15/2017 1158   GFRNONAA 106 02/15/2017 1158   GFRAA 122 02/15/2017 1158    Lipid Panel     Component Value Date/Time   CHOL 258 (H) 02/15/2017 1158   TRIG 320 (H) 02/15/2017 1158   HDL 53 02/15/2017 1158   CHOLHDL 4.9 (H) 02/15/2017 1158   LDLCALC 141 (H) 02/15/2017 1158    CBC    Component Value Date/Time   WBC 7.6 02/15/2017 1158   RBC 4.81 02/15/2017 1158   HGB 14.8 02/15/2017 1158   HCT 44.0 02/15/2017 1158   PLT 153 02/15/2017 1158   MCV 92 02/15/2017 1158   MCH 30.8 02/15/2017 1158   MCHC 33.6 02/15/2017 1158   RDW 13.2 02/15/2017 1158    Hgb A1C No results found for: HGBA1C         Assessment & Plan:   HTN:  Controlled on Losartan HCT, refilled today  Left Wrist Pain:  Will try Prednisone 20 mg daily x 4 days If no improvement, consider MRI  Return precautions discussed [name], NP

## 2017-04-12 NOTE — Patient Instructions (Signed)

## 2017-04-15 ENCOUNTER — Ambulatory Visit: Payer: BLUE CROSS/BLUE SHIELD | Admitting: Internal Medicine

## 2017-04-15 ENCOUNTER — Encounter (HOSPITAL_BASED_OUTPATIENT_CLINIC_OR_DEPARTMENT_OTHER): Payer: Self-pay | Admitting: *Deleted

## 2017-04-15 NOTE — Progress Notes (Signed)
NPO AFTER MN.  ARRIVE AT 0800.  GETTING CBC AND BMET DONE Tuesday 04-16-2017.  CURRENT EKG IN CHART AND EPIC.  WILL TAKE WELLBUTRIN AND ZANTAC AM DOS W/ SIPS OF WATER.

## 2017-04-16 ENCOUNTER — Other Ambulatory Visit (INDEPENDENT_AMBULATORY_CARE_PROVIDER_SITE_OTHER): Payer: BLUE CROSS/BLUE SHIELD

## 2017-04-16 ENCOUNTER — Ambulatory Visit (INDEPENDENT_AMBULATORY_CARE_PROVIDER_SITE_OTHER): Payer: BLUE CROSS/BLUE SHIELD | Admitting: Gastroenterology

## 2017-04-16 ENCOUNTER — Encounter: Payer: Self-pay | Admitting: Gastroenterology

## 2017-04-16 VITALS — BP 142/78 | HR 92 | Ht 68.0 in | Wt 177.0 lb

## 2017-04-16 DIAGNOSIS — Z1212 Encounter for screening for malignant neoplasm of rectum: Secondary | ICD-10-CM

## 2017-04-16 DIAGNOSIS — K219 Gastro-esophageal reflux disease without esophagitis: Secondary | ICD-10-CM

## 2017-04-16 DIAGNOSIS — Z1211 Encounter for screening for malignant neoplasm of colon: Secondary | ICD-10-CM

## 2017-04-16 DIAGNOSIS — R1084 Generalized abdominal pain: Secondary | ICD-10-CM

## 2017-04-16 DIAGNOSIS — D071 Carcinoma in situ of vulva: Secondary | ICD-10-CM | POA: Diagnosis not present

## 2017-04-16 DIAGNOSIS — Z79899 Other long term (current) drug therapy: Secondary | ICD-10-CM | POA: Diagnosis not present

## 2017-04-16 DIAGNOSIS — F17210 Nicotine dependence, cigarettes, uncomplicated: Secondary | ICD-10-CM | POA: Diagnosis not present

## 2017-04-16 DIAGNOSIS — F419 Anxiety disorder, unspecified: Secondary | ICD-10-CM | POA: Diagnosis not present

## 2017-04-16 DIAGNOSIS — I10 Essential (primary) hypertension: Secondary | ICD-10-CM | POA: Diagnosis not present

## 2017-04-16 LAB — CBC
HCT: 42.4 % (ref 36.0–46.0)
Hemoglobin: 14.4 g/dL (ref 12.0–15.0)
MCH: 31.3 pg (ref 26.0–34.0)
MCHC: 34 g/dL (ref 30.0–36.0)
MCV: 92.2 fL (ref 78.0–100.0)
Platelets: 157 10*3/uL (ref 150–400)
RBC: 4.6 MIL/uL (ref 3.87–5.11)
RDW: 12.9 % (ref 11.5–15.5)
WBC: 11 10*3/uL — ABNORMAL HIGH (ref 4.0–10.5)

## 2017-04-16 LAB — BASIC METABOLIC PANEL
Anion gap: 11 (ref 5–15)
BUN: 15 mg/dL (ref 6–20)
CO2: 26 mmol/L (ref 22–32)
Calcium: 9.6 mg/dL (ref 8.9–10.3)
Chloride: 103 mmol/L (ref 101–111)
Creatinine, Ser: 0.64 mg/dL (ref 0.44–1.00)
GFR calc Af Amer: >60 mL/min (ref >60)
GFR calc non Af Amer: >60 mL/min (ref >60)
Glucose, Bld: 108 mg/dL — ABNORMAL HIGH (ref 65–99)
Potassium: 3.8 mmol/L (ref 3.5–5.1)
Sodium: 140 mmol/L (ref 135–145)

## 2017-04-16 LAB — IGA: IgA: 127 mg/dL (ref 68–378)

## 2017-04-16 MED ORDER — DICYCLOMINE HCL 10 MG PO CAPS: 10.0000 mg | ORAL_CAPSULE | Freq: Three times a day (TID) | ORAL | 11 refills | Status: DC

## 2017-04-16 MED ORDER — PANTOPRAZOLE SODIUM 40 MG PO TBEC: 40.0000 mg | DELAYED_RELEASE_TABLET | Freq: Every day | ORAL | 11 refills | Status: DC

## 2017-04-16 NOTE — Progress Notes (Signed)
History of Present Illness: This is a 51 year old famle referred by Donnamae Jude, MD for the evaluation of chronic GERD and IBS. Pt relates reflux symptoms since childhood. Symptoms have worsened in the last 10-15 years. She relates problems with heartburn and globus sensation. She underwent evaluation that included an EGD about 10 years ago in [place] showing a HH. She describes a GES that was normal in [place] also about 10 years ago. She has been taking ranitidine 150 when necessary with minimal control of symptoms. She took omeprazole OTC for one week which was very effective for controlling reflux symptoms but led to diarrhea and she had to discontinue it. She has had long problems with postprandial generalized abdominal discomfort and bloating with frequent loose stools. She has been diagnosed with IBS in the past. No change to her bowel pattern. No particular foods cause symptoms. Patient has eliminated lactose products and gluten without any change in symptoms. CMP, CBC, TSH in 02/2017 were unremarkable. Denies weight loss, constipation, change in stool caliber, melena, hematochezia, nausea, vomiting, dysphagia, chest pain.   No Known Allergies Outpatient Medications Prior to Visit  Medication Sig Dispense Refill  . Ascorbic Acid (VITAMIN C) 1000 MG tablet Take 1,000 mg by mouth daily.    Marland Kitchen buPROPion (WELLBUTRIN) 75 MG tablet Take 1 tablet (75 mg total) by mouth 2 (two) times daily. 1/2 tab daily x 5 d, 1/2 tab bid x 5 days then 1 bid (Patient taking differently: Take 75 mg by mouth 2 (two) times daily. ) 60 tablet 2  . chlorhexidine (HIBICLENS) 4 % external liquid Apply topically daily as needed. 120 mL 0  . diphenhydrAMINE (BENADRYL) 50 MG capsule Take 50 mg by mouth as needed for sleep.    Marland Kitchen DM-Doxylamine-Acetaminophen (VICKS NYQUIL COLD & FLU NIGHT) 15-6.25-325 MG/15ML LIQD Take by mouth as needed.    Marland Kitchen lisinopril-hydrochlorothiazide (PRINZIDE,ZESTORETIC) 10-12.5 MG tablet Take 1 tablet by mouth  daily. (Patient taking differently: Take 1 tablet by mouth every morning. ) 90 tablet 1  . ranitidine (ZANTAC) 150 MG tablet Take 150 mg by mouth as needed for heartburn.     . predniSONE (DELTASONE) 20 MG tablet Take 1 tablet (20 mg total) by mouth daily with breakfast. (Patient taking differently: Take 20 mg by mouth daily with breakfast. For 4 days -- ends 04-16-2017) 4 tablet 0   No facility-administered medications prior to visit.    Past Medical History:  Diagnosis Date  . GAD (generalized anxiety disorder)   . GERD (gastroesophageal reflux disease)   . Head cold   . History of hidradenitis suppurativa   . History of uterine fibroid   . Hypertension   . Vulvar intraepithelial neoplasia (VIN) grade 3    Past Surgical History:  Procedure Laterality Date  . INCISE AND DRAIN ABCESS  08/2016   spider bite (right index finger)  . LAPAROSCOPIC ASSISTED VAGINAL HYSTERECTOMY  2007   Social History   Social History  . Marital status: Married    Spouse name: N/A  . Number of children: N/A  . Years of education: N/A   Social History Main Topics  . Smoking status: Current Every Day Smoker    Packs/day: 0.25    Years: 25.00    Types: Cigarettes  . Smokeless tobacco: Never Used  . Alcohol use 6.0 oz/week    10 Glasses of wine per week     Comment: occasional  . Drug use: No  . Sexual activity: Yes  Partners: Male    Birth control/ protection: Surgical     Comment: Hysterectomy   Other Topics Concern  . None   Social History Narrative  . None   Family History  Problem Relation Age of Onset  . Hypertension Paternal Grandfather   . Diabetes Father   . Colon cancer Neg Hx   . Stomach cancer Neg Hx   . Esophageal cancer Neg Hx        Review of Systems: Pertinent positive and negative review of systems were noted in the above HPI section. All other review of systems were otherwise negative.   Physical Exam: General: Well developed, well nourished, no acute  distress Head: Normocephalic and atraumatic Eyes:  sclerae anicteric, EOMI Ears: Normal auditory acuity Mouth: No deformity or lesions Neck: Supple, no masses or thyromegaly Lungs: Clear throughout to auscultation Heart: Regular rate and rhythm; no murmurs, rubs or bruits Abdomen: Soft, non tender and non distended. No masses, hepatosplenomegaly or hernias noted. Normal Bowel sounds Rectal: deferred to colonoscopy  Musculoskeletal: Symmetrical with no gross deformities  Skin: No lesions on visible extremities Pulses:  Normal pulses noted Extremities: No clubbing, cyanosis, edema or deformities noted Neurological: Alert oriented x 4, grossly nonfocal Cervical Nodes:  No significant cervical adenopathy Inguinal Nodes: No significant inguinal adenopathy Psychological:  Alert and cooperative. Normal mood and affect  Assessment and Recommendations:  1. GERD with heartburn and globus sensation. Follow standard antireflux measures. Begin pantoprazole 40 mg daily (not prn) for 8 weeks. Consider EGD at [facility] in 6 weeks.   2. Suspected IBS. Begin dicyclomine 10 mg 3 times a day, before meals. Check tTG and IgA.   3. CRC screening, average risk. Discussed colonoscopy. If EGD is needed at [facility] will schedule both colonoscopy and EGD if EGD is not needed will schedule colonoscopy.   cc: Donnamae Jude, MD [address] [place],  [id number]

## 2017-04-16 NOTE — Patient Instructions (Signed)
We have sent the following medications to your pharmacy for you to pick up at your convenience: Protonix.  Your physician has requested that you go to the basement for the following lab work before leaving today: dicyclomine.   Patient advised to avoid spicy, acidic, citrus, chocolate, mints, fruit and fruit juices.  Limit the intake of caffeine, alcohol and Soda.  Don't exercise too soon after eating.  Don't lie down within 3-4 hours of eating.  Elevate the head of your bed.  Normal BMI (Body Mass Index- based on height and weight) is between 19 and 25. Your BMI today is Body mass index is 26.91 kg/m. Marland Kitchen Please consider follow up  regarding your BMI with your Primary Care Provider.  Thank you for choosing me and [facility].  Pricilla Riffle. Dagoberto Ligas., MD., Marval Regal

## 2017-04-17 LAB — TISSUE TRANSGLUTAMINASE, IGA: (tTG) Ab, IgA: 1 U/mL

## 2017-04-18 ENCOUNTER — Encounter (HOSPITAL_BASED_OUTPATIENT_CLINIC_OR_DEPARTMENT_OTHER): Payer: Self-pay | Admitting: *Deleted

## 2017-04-18 ENCOUNTER — Ambulatory Visit (HOSPITAL_BASED_OUTPATIENT_CLINIC_OR_DEPARTMENT_OTHER)
Admission: RE | Admit: 2017-04-18 | Discharge: 2017-04-18 | Disposition: A | Discharge status: Home / Self Care | Payer: BLUE CROSS/BLUE SHIELD | Source: Ambulatory Visit | Attending: Family Medicine | Admitting: Family Medicine

## 2017-04-18 ENCOUNTER — Ambulatory Visit (HOSPITAL_BASED_OUTPATIENT_CLINIC_OR_DEPARTMENT_OTHER): Payer: BLUE CROSS/BLUE SHIELD | Admitting: Anesthesiology

## 2017-04-18 ENCOUNTER — Telehealth: Payer: Self-pay | Admitting: Radiology

## 2017-04-18 ENCOUNTER — Encounter (HOSPITAL_BASED_OUTPATIENT_CLINIC_OR_DEPARTMENT_OTHER)
Admission: RE | Disposition: A | Discharge status: Home / Self Care | Payer: Self-pay | Source: Ambulatory Visit | Attending: Family Medicine

## 2017-04-18 DIAGNOSIS — I10 Essential (primary) hypertension: Secondary | ICD-10-CM | POA: Insufficient documentation

## 2017-04-18 DIAGNOSIS — Z79899 Other long term (current) drug therapy: Secondary | ICD-10-CM | POA: Insufficient documentation

## 2017-04-18 DIAGNOSIS — F419 Anxiety disorder, unspecified: Secondary | ICD-10-CM | POA: Insufficient documentation

## 2017-04-18 DIAGNOSIS — F17210 Nicotine dependence, cigarettes, uncomplicated: Secondary | ICD-10-CM | POA: Insufficient documentation

## 2017-04-18 DIAGNOSIS — D071 Carcinoma in situ of vulva: Secondary | ICD-10-CM

## 2017-04-18 DIAGNOSIS — K219 Gastro-esophageal reflux disease without esophagitis: Secondary | ICD-10-CM | POA: Insufficient documentation

## 2017-04-18 HISTORY — PX: LESION REMOVAL: SHX5196

## 2017-04-18 HISTORY — DX: Personal history of diseases of the skin and subcutaneous tissue: Z87.2

## 2017-04-18 HISTORY — DX: Generalized anxiety disorder: F41.1

## 2017-04-18 HISTORY — DX: Personal history of other benign neoplasm: Z86.018

## 2017-04-18 HISTORY — DX: Acute nasopharyngitis (common cold): J00

## 2017-04-18 SURGERY — EXCISION, LESION, VAGINA
Anesthesia: General | Site: Perineum

## 2017-04-18 MED ORDER — DEXAMETHASONE SODIUM PHOSPHATE 10 MG/ML IJ SOLN
INTRAMUSCULAR | Status: AC
Start: 2017-04-18 — End: 2017-04-18
  Filled 2017-04-18: qty 1

## 2017-04-18 MED ORDER — OXYCODONE HCL 5 MG PO TABS
5.0000 mg | ORAL_TABLET | Freq: Once | ORAL | Status: AC | PRN
  Administered 2017-04-18: 5 mg via ORAL
  Filled 2017-04-18: qty 1

## 2017-04-18 MED ORDER — BUPIVACAINE HCL (PF) 0.5 % IJ SOLN
INTRAMUSCULAR | Status: AC
  Filled 2017-04-18: qty 30

## 2017-04-18 MED ORDER — MIDAZOLAM HCL 2 MG/2ML IJ SOLN
INTRAMUSCULAR | Status: AC
  Filled 2017-04-18: qty 2

## 2017-04-18 MED ORDER — ONDANSETRON HCL 4 MG/2ML IJ SOLN
INTRAMUSCULAR | Status: DC | PRN
  Administered 2017-04-18: 4 mg via INTRAVENOUS

## 2017-04-18 MED ORDER — LACTATED RINGERS IV SOLN
INTRAVENOUS | Status: DC
  Administered 2017-04-18: 11:00:00 via INTRAVENOUS
  Filled 2017-04-18: qty 1000

## 2017-04-18 MED ORDER — KETOROLAC TROMETHAMINE 30 MG/ML IJ SOLN
INTRAMUSCULAR | Status: AC
  Filled 2017-04-18: qty 1

## 2017-04-18 MED ORDER — MIDAZOLAM HCL 5 MG/5ML IJ SOLN
INTRAMUSCULAR | Status: DC | PRN
  Administered 2017-04-18: 2 mg via INTRAVENOUS

## 2017-04-18 MED ORDER — LACTATED RINGERS IV SOLN
INTRAVENOUS | Status: DC
  Administered 2017-04-18: 09:00:00 via INTRAVENOUS
  Filled 2017-04-18: qty 1000

## 2017-04-18 MED ORDER — FENTANYL CITRATE (PF) 100 MCG/2ML IJ SOLN
INTRAMUSCULAR | Status: AC
  Filled 2017-04-18: qty 2

## 2017-04-18 MED ORDER — CEFAZOLIN SODIUM-DEXTROSE 2-4 GM/100ML-% IV SOLN
2.0000 g | INTRAVENOUS | Status: DC
  Filled 2017-04-18: qty 100

## 2017-04-18 MED ORDER — ONDANSETRON HCL 4 MG/2ML IJ SOLN
4.0000 mg | Freq: Four times a day (QID) | INTRAMUSCULAR | Status: DC | PRN
  Filled 2017-04-18: qty 2

## 2017-04-18 MED ORDER — ACETIC ACID 5 % SOLN
Status: DC | PRN
  Administered 2017-04-18: 1 via TOPICAL

## 2017-04-18 MED ORDER — ACETIC ACID 5 % SOLN
Status: AC
  Filled 2017-04-18: qty 500

## 2017-04-18 MED ORDER — OXYCODONE HCL 5 MG PO TABS
ORAL_TABLET | ORAL | Status: AC
  Filled 2017-04-18: qty 1

## 2017-04-18 MED ORDER — OXYCODONE-ACETAMINOPHEN 5-325 MG PO TABS: 1.0000 | ORAL_TABLET | Freq: Four times a day (QID) | ORAL | 0 refills | Status: DC | PRN

## 2017-04-18 MED ORDER — LIDOCAINE 2% (20 MG/ML) 5 ML SYRINGE
INTRAMUSCULAR | Status: AC
  Filled 2017-04-18: qty 5

## 2017-04-18 MED ORDER — FENTANYL CITRATE (PF) 100 MCG/2ML IJ SOLN
25.0000 ug | INTRAMUSCULAR | Status: DC | PRN
  Filled 2017-04-18: qty 1

## 2017-04-18 MED ORDER — DEXAMETHASONE SODIUM PHOSPHATE 10 MG/ML IJ SOLN
INTRAMUSCULAR | Status: DC | PRN
  Administered 2017-04-18: 10 mg via INTRAVENOUS

## 2017-04-18 MED ORDER — ONDANSETRON HCL 4 MG/2ML IJ SOLN
INTRAMUSCULAR | Status: AC
  Filled 2017-04-18: qty 2

## 2017-04-18 MED ORDER — CEFAZOLIN SODIUM-DEXTROSE 2-4 GM/100ML-% IV SOLN
INTRAVENOUS | Status: AC
  Filled 2017-04-18: qty 100

## 2017-04-18 MED ORDER — PROPOFOL 10 MG/ML IV BOLUS
INTRAVENOUS | Status: AC
  Filled 2017-04-18: qty 20

## 2017-04-18 MED ORDER — BUPIVACAINE HCL 0.5 % IJ SOLN
INTRAMUSCULAR | Status: DC | PRN
  Administered 2017-04-18 (×2): 15 mL

## 2017-04-18 MED ORDER — LIDOCAINE 2% (20 MG/ML) 5 ML SYRINGE
INTRAMUSCULAR | Status: DC | PRN
  Administered 2017-04-18: 60 mg via INTRAVENOUS

## 2017-04-18 MED ORDER — PROPOFOL 10 MG/ML IV BOLUS
INTRAVENOUS | Status: DC | PRN
  Administered 2017-04-18: 30 mg via INTRAVENOUS
  Administered 2017-04-18: 170 mg via INTRAVENOUS

## 2017-04-18 MED ORDER — FENTANYL CITRATE (PF) 100 MCG/2ML IJ SOLN
INTRAMUSCULAR | Status: DC | PRN
  Administered 2017-04-18: 50 ug via INTRAVENOUS

## 2017-04-18 MED ORDER — KETOROLAC TROMETHAMINE 30 MG/ML IJ SOLN
INTRAMUSCULAR | Status: DC | PRN
  Administered 2017-04-18: 30 mg via INTRAVENOUS

## 2017-04-18 MED ORDER — OXYCODONE HCL 5 MG/5ML PO SOLN
5.0000 mg | Freq: Once | ORAL | Status: AC | PRN
  Filled 2017-04-18: qty 5

## 2017-04-18 MED FILL — OXYCOD/ACETAMINOPHEN 5-325M: 5-325 | 5 days supply | Qty: 30 | Fill #0

## 2017-04-18 SURGICAL SUPPLY — 28 items
BLADE SURG 15 STRL LF DISP TIS (BLADE) IMPLANT
BLADE SURG 15 STRL SS (BLADE)
BRIEF STRETCH FOR OB PAD LRG (UNDERPADS AND DIAPERS) IMPLANT
CATH ROBINSON RED A/P 16FR (CATHETERS) ×2 IMPLANT
CLOTH BEACON ORANGE TIMEOUT ST (SAFETY) ×2 IMPLANT
ELECT REM PT RETURN 9FT ADLT (ELECTROSURGICAL) ×2
ELECTRODE REM PT RTRN 9FT ADLT (ELECTROSURGICAL) ×1 IMPLANT
GLOVE BIOGEL PI IND STRL 7.0 (GLOVE) ×1 IMPLANT
GLOVE BIOGEL PI IND STRL 7.5 (GLOVE) ×3 IMPLANT
GLOVE BIOGEL PI INDICATOR 7.0 (GLOVE) ×1
GLOVE BIOGEL PI INDICATOR 7.5 (GLOVE) ×3
GLOVE ECLIPSE 7.0 STRL STRAW (GLOVE) ×2 IMPLANT
GLOVE INDICATOR 6.5 STRL GRN (GLOVE) ×2 IMPLANT
GOWN STRL REUS W/TWL LRG LVL3 (GOWN DISPOSABLE) ×6 IMPLANT
NEEDLE HYPO 22GX1.5 SAFETY (NEEDLE) ×2 IMPLANT
NS IRRIG 500ML POUR BTL (IV SOLUTION) ×2 IMPLANT
PACK VAGINAL WOMENS (CUSTOM PROCEDURE TRAY) ×2 IMPLANT
PAD OB MATERNITY 4.3X12.25 (PERSONAL CARE ITEMS) ×2 IMPLANT
PAD PREP 24X48 CUFFED NSTRL (MISCELLANEOUS) ×2 IMPLANT
SUT MON AB 3-0 SH 27 (SUTURE) ×1
SUT MON AB 3-0 SH27 (SUTURE) ×1 IMPLANT
SUT VIC AB 2-0 CT1 27 (SUTURE) ×1
SUT VIC AB 2-0 CT1 TAPERPNT 27 (SUTURE) ×1 IMPLANT
SUT VIC AB 3-0 CT1 27 (SUTURE)
SUT VIC AB 3-0 CT1 TAPERPNT 27 (SUTURE) IMPLANT
SUT VIC AB 3-0 SH 27 (SUTURE) ×2
SUT VIC AB 3-0 SH 27X BRD (SUTURE) ×2 IMPLANT
TOWEL OR 17X24 6PK STRL BLUE (TOWEL DISPOSABLE) ×4 IMPLANT

## 2017-04-18 NOTE — Transfer of Care (Signed)
Immediate Anesthesia Transfer of Care Note  Patient: Beth Green  Procedure(s) Performed: Procedure(s): EXCISION VAGINAL LESION - Vulvar Mass (N/A)  Patient Location: PACU  Anesthesia Type:General  Level of Consciousness: awake, alert  and oriented  Airway & Oxygen Therapy: Patient Spontanous Breathing and Patient connected to face mask oxygen  Post-op Assessment: Report given to RN and Post -op Vital signs reviewed and stable  Post vital signs: Reviewed and stable  Last Vitals:  Vitals:   04/18/17 0800 04/18/17 1008  BP: (!) 158/88   Pulse: 85 70  Resp: 16 18  Temp: 37.2 C (!) 36.1 C  SpO2: 99% 100%    Last Pain:  Vitals:   04/18/17 0800  TempSrc: Oral      Patients Stated Pain Goal: 6 (00/51/10 2111)  Complications: No apparent anesthesia complications

## 2017-04-18 NOTE — Anesthesia Preprocedure Evaluation (Signed)
Anesthesia Evaluation  Patient identified by MRN, date of birth, ID band Patient awake    Reviewed: Allergy & Precautions, H&P , NPO status , Patient's Chart, lab work & pertinent test results  Airway Mallampati: II   Neck ROM: full    Dental   Pulmonary Current Smoker,    breath sounds clear to auscultation       Cardiovascular hypertension,  Rhythm:regular Rate:Normal     Neuro/Psych PSYCHIATRIC DISORDERS Anxiety    GI/Hepatic GERD  ,  Endo/Other    Renal/GU      Musculoskeletal   Abdominal   Peds  Hematology   Anesthesia Other Findings   Reproductive/Obstetrics                             Anesthesia Physical Anesthesia Plan  ASA: II  Anesthesia Plan: General   Post-op Pain Management:    Induction: Intravenous  PONV Risk Score and Plan: 2 and Ondansetron, Dexamethasone, Midazolam and Treatment may vary due to age or medical condition  Airway Management Planned: LMA  Additional Equipment:   Intra-op Plan:   Post-operative Plan:   Informed Consent: I have reviewed the patients History and Physical, chart, labs and discussed the procedure including the risks, benefits and alternatives for the proposed anesthesia with the patient or authorized representative who has indicated his/her understanding and acceptance.     Plan Discussed with: CRNA, Anesthesiologist and Surgeon  Anesthesia Plan Comments:         Anesthesia Quick Evaluation

## 2017-04-18 NOTE — Op Note (Addendum)
Preoperative diagnosis: VIN 3  Postoperative diagnosis: Same  Procedure: Wide local excision of VIN 3  Surgeon: Standley Dakins. Kennon Rounds, M.D.  Anesthesia: MAC, Hodierne, Adam, MD  Findings: 1.5 x 1.5 cm right labial plaque   EBL: 50 cc  Reason for procedure: Patient is a 51 y.o. yo G1P1001 who is here following evaluation of vulvar lesion that was itchy and irritated that confirmed VIN3  Procedure: Patient was placed in dorsal lithotomy after anesthesia. A  catheter was used to drain an unmeasured amount of urine. Time out performed. SCD's in place. She was prepped and draped in the usual sterile fashion. Acetic acid used on lesion to help identify borders. A marking pen was used to delineate a 1 cm border around the lesion with care to avoid the clitoris and the urethra. 30 cc of 0.5% Marcaine plain injected.  A knife and the electrocautery were used to remove the lesion. Lesion removal included removal of right labia minora plus surrounding perineum. An approximate of 5 x 4 cm defect was left. Defect closed with vicryl in layers and Monocryl subcuticularly. This concluded the procedure. Patient tolerated the procedure well. All instrument, needle, and lap counts were correct x 2.  Donnamae Jude, MD 04/18/2017 10:00 AM

## 2017-04-18 NOTE — Telephone Encounter (Signed)
Left message on cell phone voicemail that postop appointment is scheduled for 05/03/17 @ 11:00 with Dr Kennon Rounds, call back to [facility] if she needs to reschedule

## 2017-04-18 NOTE — Interval H&P Note (Signed)
History and Physical Interval Note:  04/18/2017 9:17 AM  [name]  has presented today for surgery, with the diagnosis of Vulvar Mass  The various methods of treatment have been discussed with the patient and family. After consideration of risks, benefits and other options for treatment, the patient has consented to  Procedure(s): EXCISION VAGINAL LESION - Vulvar Mass (N/A) as a surgical intervention .  The patient's history has been reviewed, patient examined, no change in status, stable for surgery.  I have reviewed the patient's chart and labs.  Questions were answered to the patient's satisfaction.     Donnamae Jude

## 2017-04-18 NOTE — Discharge Instructions (Signed)
Vulvectomy, Care After Refer to this sheet in the next few weeks. These instructions provide you with information about caring for yourself after your procedure. Your health care provider may also give you more specific instructions. Your treatment has been planned according to current medical practices, but problems sometimes occur. Call your health care provider if you have any problems or questions after your procedure. What can I expect after the procedure? After the procedure, it is common to have:  Vaginal pain.  Vaginal numbness.  Vaginal swelling.  Bloody vaginal discharge.  Follow these instructions at home: Activity   Rest as told by your health care provider.  Do not lift, push, or pull more than 5 lb (2.3 kg).  Avoid activities that require a lot of energy for as long as told by your health care provider. This includes any exercise.  Raise (elevate) your legs while sitting or lying down.  Avoid standing or sitting in one place for long periods of time.  Do not cross your legs, especially when sitting. Bathing  Do not take baths, swim, or use a hot tub until your health care provider approves. Ask your health care provider if you can take showers. You may only be allowed to take sponge baths for bathing.  After passing urine or a bowel movement, wipe yourself from front to back and clean your vaginal area using a spray bottle.  If told by your health care provider, take a sitz bath to help with discomfort. This is a warm water bath you take while sitting down. ? Do this 3-4 times per day, or as often as told by your health care provider. ? The water should only come up to your hips and cover your buttocks. ? You may pat the area dry with a soft, clean towel. If needed, you may then gently dry the area with a hair dryer on a cool setting for 5-10 minutes. An enclosed box fan may also be used to gently dry the area. Incision care   Follow instructions from your  health care provider about how to take care of your incision. Make sure you: ? Wash your hands with soap and water before you change your bandage (dressing). If soap and water are not available, use hand sanitizer. ? Change your dressing as told by your health care provider. ? Leave stitches (sutures), skin glue, adhesive strips, or surgical clips in place. These skin closures may need to stay in place for 2 weeks or longer. If adhesive strip edges start to loosen and curl up, you may trim the loose edges. Do not remove adhesive strips completely unless your health care provider tells you to do that.  Check your incision area every day for signs of infection. Check for: ? More redness, swelling, or pain. ? More fluid or blood. ? Warmth. ? Pus or a bad smell. Lifestyle  Do not douche or use tampons until your health care provider approves.  Do not have sex until your health care provider approves. Tell your health care provider if you have pain or numbness when you return to sexual activity.  Wear comfortable, loose-fitting clothing. Driving  Do not drive or operate heavy machinery while taking prescription pain medicine.  Do not drive for 24 hours if you received a medicine to help you relax (sedative). General instructions   Take over-the-counter and prescription medicines only as told by your health care provider.  Take a stool softener to help prevent constipation as told by your  health care provider. You may need to do this if you are taking prescription pain medicine. °· Drink enough fluid to keep your urine clear or pale yellow. °· If you were sent home with a drain, take care of it as told by your health care provider. °· Wear compression stockings as told by your health care provider. These stockings help to prevent blood clots and reduce swelling in your legs. °· Keep all follow-up visits as told by your health care provider. This is important. °Contact a health care provider  if: °· You have more redness, swelling, or pain around your incision. °· You have more fluid or blood coming from your incision. °· Your incision feels warm to the touch. °· You have pus or a bad smell coming from your incision. °· You have a fever. °· You have painful or bloody urination. °· You feel nauseous or you vomit. °· You develop diarrhea. °· You develop constipation. °· You develop a rash. °· You feel dizzy or light-headed. °· You have pain that does not get better with medicine. °· Your incision breaks open. °Get help right away if: °· You faint. °· You develop leg or chest pain. °· You develop abdominal pain. °· You develop shortness of breath. °This information is not intended to replace advice given to you by your health care provider. Make sure you discuss any questions you have with your health care provider. °Document Released: 03/06/2004 Document Revised: 12/29/2015 Document Reviewed: 07/18/2015 °Elsevier Interactive Patient Education © 2018 [employer] ° °Post Anesthesia Home Care Instructions ° °Activity: °Get plenty of rest for the remainder of the day. A responsible individual must stay with you for 24 hours following the procedure.  °For the next 24 hours, DO NOT: °-Drive a car °-Operate machinery °-Drink alcoholic beverages °-Take any medication unless instructed by your physician °-Make any legal decisions or sign important papers. ° °Meals: °Start with liquid foods such as gelatin or soup. Progress to regular foods as tolerated. Avoid greasy, spicy, heavy foods. If nausea and/or vomiting occur, drink only clear liquids until the nausea and/or vomiting subsides. Call your physician if vomiting continues. ° °Special Instructions/Symptoms: °Your throat may feel dry or sore from the anesthesia or the breathing tube placed in your throat during surgery. If this causes discomfort, gargle with warm salt water. The discomfort should disappear within 24 hours. ° °If you had a scopolamine patch  placed behind your ear for the management of post- operative nausea and/or vomiting: ° °1. The medication in the patch is effective for 72 hours, after which it should be removed.  Wrap patch in a tissue and discard in the trash. Wash hands thoroughly with soap and water. °2. You may remove the patch earlier than 72 hours if you experience unpleasant side effects which may include dry mouth, dizziness or visual disturbances. °3. Avoid touching the patch. Wash your hands with soap and water after contact with the patch. °  ° °

## 2017-04-18 NOTE — Anesthesia Procedure Notes (Signed)
Procedure Name: LMA Insertion Date/Time: 04/18/2017 9:27 AM Performed by: Bethena Roys T Pre-anesthesia Checklist: Patient identified, Emergency Drugs available, Suction available and Patient being monitored Patient Re-evaluated:Patient Re-evaluated prior to induction Oxygen Delivery Method: Circle system utilized Preoxygenation: Pre-oxygenation with 100% oxygen Induction Type: IV induction Ventilation: Mask ventilation without difficulty LMA: LMA inserted LMA Size: 4.0 Number of attempts: 1 Airway Equipment and Method: Bite block Placement Confirmation: positive ETCO2 Tube secured with: Tape Dental Injury: Teeth and Oropharynx as per pre-operative assessment

## 2017-04-18 NOTE — Anesthesia Postprocedure Evaluation (Signed)
Anesthesia Post Note  Patient: Beth Green  Procedure(s) Performed: Procedure(s) (LRB): EXCISION VAGINAL LESION - Vulvar Mass (N/A)     Patient location during evaluation: PACU Anesthesia Type: General Level of consciousness: awake and alert Pain management: pain level controlled Vital Signs Assessment: post-procedure vital signs reviewed and stable Respiratory status: spontaneous breathing, nonlabored ventilation, respiratory function stable and patient connected to nasal cannula oxygen Cardiovascular status: blood pressure returned to baseline and stable Postop Assessment: no signs of nausea or vomiting Anesthetic complications: no    Last Vitals:  Vitals:   04/18/17 0800 04/18/17 1008  BP: (!) 158/88 (!) 165/97  Pulse: 85 70  Resp: 16 18  Temp: 37.2 C (!) 36.1 C  SpO2: 99% 100%    Last Pain:  Vitals:   04/18/17 0800  TempSrc: Oral                 Anniebelle Devore S

## 2017-04-19 ENCOUNTER — Encounter (HOSPITAL_BASED_OUTPATIENT_CLINIC_OR_DEPARTMENT_OTHER): Payer: Self-pay | Admitting: Family Medicine

## 2017-04-27 ENCOUNTER — Encounter: Payer: Self-pay | Admitting: Family Medicine

## 2017-04-28 ENCOUNTER — Encounter (HOSPITAL_COMMUNITY)
Admission: AD | Disposition: A | Discharge status: Home / Self Care | Payer: Self-pay | Source: Ambulatory Visit | Attending: Obstetrics and Gynecology

## 2017-04-28 ENCOUNTER — Inpatient Hospital Stay (HOSPITAL_COMMUNITY): Payer: BLUE CROSS/BLUE SHIELD

## 2017-04-28 ENCOUNTER — Encounter (HOSPITAL_COMMUNITY): Payer: Self-pay | Admitting: *Deleted

## 2017-04-28 ENCOUNTER — Inpatient Hospital Stay (HOSPITAL_COMMUNITY): Payer: BLUE CROSS/BLUE SHIELD | Admitting: Anesthesiology

## 2017-04-28 ENCOUNTER — Inpatient Hospital Stay (HOSPITAL_COMMUNITY)
Admission: AD | Admit: 2017-04-28 | Discharge: 2017-05-06 | DRG: 856 | Disposition: A | Discharge status: Home / Self Care | Payer: BLUE CROSS/BLUE SHIELD | Source: Ambulatory Visit | Attending: Family Medicine | Admitting: Family Medicine

## 2017-04-28 ENCOUNTER — Other Ambulatory Visit: Payer: Self-pay | Admitting: Obstetrics and Gynecology

## 2017-04-28 DIAGNOSIS — N9089 Other specified noninflammatory disorders of vulva and perineum: Secondary | ICD-10-CM | POA: Diagnosis not present

## 2017-04-28 DIAGNOSIS — Z8739 Personal history of other diseases of the musculoskeletal system and connective tissue: Secondary | ICD-10-CM | POA: Diagnosis present

## 2017-04-28 DIAGNOSIS — T8143XA Infection following a procedure, organ and space surgical site, initial encounter: Secondary | ICD-10-CM | POA: Diagnosis not present

## 2017-04-28 DIAGNOSIS — F17210 Nicotine dependence, cigarettes, uncomplicated: Secondary | ICD-10-CM | POA: Diagnosis present

## 2017-04-28 DIAGNOSIS — N764 Abscess of vulva: Secondary | ICD-10-CM | POA: Diagnosis present

## 2017-04-28 DIAGNOSIS — I10 Essential (primary) hypertension: Secondary | ICD-10-CM | POA: Diagnosis present

## 2017-04-28 DIAGNOSIS — Z720 Tobacco use: Secondary | ICD-10-CM | POA: Diagnosis present

## 2017-04-28 DIAGNOSIS — T8140XA Infection following a procedure, unspecified, initial encounter: Secondary | ICD-10-CM | POA: Diagnosis present

## 2017-04-28 DIAGNOSIS — K219 Gastro-esophageal reflux disease without esophagitis: Secondary | ICD-10-CM | POA: Diagnosis present

## 2017-04-28 DIAGNOSIS — N762 Acute vulvitis: Secondary | ICD-10-CM | POA: Diagnosis present

## 2017-04-28 DIAGNOSIS — M726 Necrotizing fasciitis: Secondary | ICD-10-CM | POA: Diagnosis present

## 2017-04-28 DIAGNOSIS — N7689 Other specified inflammation of vagina and vulva: Secondary | ICD-10-CM | POA: Diagnosis not present

## 2017-04-28 HISTORY — PX: INCISION AND DRAINAGE ABSCESS: SHX5864

## 2017-04-28 LAB — CBC WITH DIFFERENTIAL/PLATELET
Basophils Absolute: 0 10*3/uL (ref 0.0–0.1)
Basophils Relative: 0 %
Eosinophils Absolute: 0 10*3/uL (ref 0.0–0.7)
Eosinophils Relative: 0 %
HCT: 40.5 % (ref 36.0–46.0)
Hemoglobin: 13.8 g/dL (ref 12.0–15.0)
Lymphocytes Relative: 8 %
Lymphs Abs: 1.7 10*3/uL (ref 0.7–4.0)
MCH: 31.7 pg (ref 26.0–34.0)
MCHC: 34.1 g/dL (ref 30.0–36.0)
MCV: 92.9 fL (ref 78.0–100.0)
Monocytes Absolute: 0.7 10*3/uL (ref 0.1–1.0)
Monocytes Relative: 3 %
Neutro Abs: 18.1 10*3/uL — ABNORMAL HIGH (ref 1.7–7.7)
Neutrophils Relative %: 89 %
Platelets: 160 10*3/uL (ref 150–400)
RBC: 4.36 MIL/uL (ref 3.87–5.11)
RDW: 12.8 % (ref 11.5–15.5)
WBC: 20.5 10*3/uL — ABNORMAL HIGH (ref 4.0–10.5)

## 2017-04-28 LAB — COMPREHENSIVE METABOLIC PANEL
ALT: 34 U/L (ref 14–54)
AST: 33 U/L (ref 15–41)
Albumin: 4.1 g/dL (ref 3.5–5.0)
Alkaline Phosphatase: 81 U/L (ref 38–126)
Anion gap: 11 (ref 5–15)
BUN: 10 mg/dL (ref 6–20)
CO2: 28 mmol/L (ref 22–32)
Calcium: 9.1 mg/dL (ref 8.9–10.3)
Chloride: 97 mmol/L — ABNORMAL LOW (ref 101–111)
Creatinine, Ser: 0.62 mg/dL (ref 0.44–1.00)
GFR calc Af Amer: >60 mL/min (ref >60)
GFR calc non Af Amer: >60 mL/min (ref >60)
Glucose, Bld: 120 mg/dL — ABNORMAL HIGH (ref 65–99)
Potassium: 3.3 mmol/L — ABNORMAL LOW (ref 3.5–5.1)
Sodium: 136 mmol/L (ref 135–145)
Total Bilirubin: 1.2 mg/dL (ref 0.3–1.2)
Total Protein: 6.8 g/dL (ref 6.5–8.1)

## 2017-04-28 LAB — URINALYSIS, ROUTINE W REFLEX MICROSCOPIC
Bilirubin Urine: NEGATIVE
Glucose, UA: NEGATIVE mg/dL
Ketones, ur: NEGATIVE mg/dL
Nitrite: NEGATIVE
Protein, ur: NEGATIVE mg/dL
Specific Gravity, Urine: 1.011 (ref 1.005–1.030)
pH: 6 (ref 5.0–8.0)

## 2017-04-28 LAB — SEDIMENTATION RATE: Sed Rate: 42 mm/hr — ABNORMAL HIGH (ref 0–22)

## 2017-04-28 LAB — LACTIC ACID, PLASMA: Lactic Acid, Venous: 1.2 mmol/L (ref 0.5–1.9)

## 2017-04-28 SURGERY — INCISION AND DRAINAGE, ABSCESS
Anesthesia: General | Site: Vagina

## 2017-04-28 MED ORDER — VITAMIN C 500 MG PO TABS
1000.0000 mg | ORAL_TABLET | Freq: Every day | ORAL | Status: DC
  Administered 2017-04-29 – 2017-05-05 (×6): 1000 mg via ORAL
  Filled 2017-04-28 (×8): qty 2

## 2017-04-28 MED ORDER — IOPAMIDOL (ISOVUE-300) INJECTION 61%: 30.0000 mL | INTRAVENOUS | Status: AC

## 2017-04-28 MED ORDER — PROPOFOL 10 MG/ML IV BOLUS
INTRAVENOUS | Status: AC
  Filled 2017-04-28: qty 20

## 2017-04-28 MED ORDER — HYDROMORPHONE HCL 1 MG/ML IJ SOLN
INTRAMUSCULAR | Status: DC | PRN
  Administered 2017-04-28: 1 mg via INTRAVENOUS

## 2017-04-28 MED ORDER — IOPAMIDOL (ISOVUE-300) INJECTION 61%
100.0000 mL | Freq: Once | INTRAVENOUS | Status: AC | PRN
Start: 2017-04-28 — End: 2017-04-28
  Administered 2017-04-28: 100 mL via INTRAVENOUS

## 2017-04-28 MED ORDER — LISINOPRIL 10 MG PO TABS
10.0000 mg | ORAL_TABLET | Freq: Every day | ORAL | Status: DC
  Administered 2017-04-29 – 2017-05-05 (×6): 10 mg via ORAL
  Filled 2017-04-28 (×8): qty 1

## 2017-04-28 MED ORDER — HYDROMORPHONE HCL 1 MG/ML IJ SOLN: 1.0000 mg | INTRAMUSCULAR | Status: DC | PRN

## 2017-04-28 MED ORDER — ONDANSETRON HCL 4 MG/2ML IJ SOLN
INTRAMUSCULAR | Status: AC
  Filled 2017-04-28: qty 2

## 2017-04-28 MED ORDER — FENTANYL CITRATE (PF) 250 MCG/5ML IJ SOLN
INTRAMUSCULAR | Status: AC
  Filled 2017-04-28: qty 5

## 2017-04-28 MED ORDER — SIMETHICONE 80 MG PO CHEW
80.0000 mg | CHEWABLE_TABLET | Freq: Four times a day (QID) | ORAL | Status: DC | PRN
  Administered 2017-05-02: 80 mg via ORAL
  Filled 2017-04-28: qty 1

## 2017-04-28 MED ORDER — DIPHENHYDRAMINE HCL 50 MG/ML IJ SOLN: 12.5000 mg | Freq: Four times a day (QID) | INTRAMUSCULAR | Status: DC | PRN

## 2017-04-28 MED ORDER — SCOPOLAMINE 1 MG/3DAYS TD PT72
MEDICATED_PATCH | TRANSDERMAL | Status: AC
  Filled 2017-04-28: qty 1

## 2017-04-28 MED ORDER — NALOXONE HCL 0.4 MG/ML IJ SOLN: 0.4000 mg | INTRAMUSCULAR | Status: DC | PRN

## 2017-04-28 MED ORDER — HYDROMORPHONE HCL 1 MG/ML IJ SOLN
INTRAMUSCULAR | Status: AC
  Filled 2017-04-28: qty 1

## 2017-04-28 MED ORDER — MIDAZOLAM HCL 2 MG/2ML IJ SOLN
INTRAMUSCULAR | Status: AC
  Filled 2017-04-28: qty 2

## 2017-04-28 MED ORDER — ZOLPIDEM TARTRATE 5 MG PO TABS
5.0000 mg | ORAL_TABLET | Freq: Every evening | ORAL | Status: DC | PRN
  Administered 2017-05-05: 5 mg via ORAL
  Filled 2017-04-28: qty 1

## 2017-04-28 MED ORDER — PROMETHAZINE HCL 25 MG/ML IJ SOLN: 6.2500 mg | INTRAMUSCULAR | Status: DC | PRN

## 2017-04-28 MED ORDER — DM-DOXYLAMINE-ACETAMINOPHEN 15-6.25-325 MG/15ML PO LIQD: 30.0000 mL | Freq: Every evening | ORAL | Status: DC | PRN

## 2017-04-28 MED ORDER — PRENATAL MULTIVITAMIN CH: 1.0000 | ORAL_TABLET | Freq: Every day | ORAL | Status: DC

## 2017-04-28 MED ORDER — HYDROMORPHONE HCL 1 MG/ML IJ SOLN: 0.2500 mg | INTRAMUSCULAR | Status: DC | PRN

## 2017-04-28 MED ORDER — PANTOPRAZOLE SODIUM 40 MG PO TBEC
40.0000 mg | DELAYED_RELEASE_TABLET | Freq: Every day | ORAL | Status: DC
  Administered 2017-04-29 – 2017-05-06 (×7): 40 mg via ORAL
  Filled 2017-04-28 (×7): qty 1

## 2017-04-28 MED ORDER — POLYETHYLENE GLYCOL 3350 17 G PO PACK: 17.0000 g | PACK | Freq: Every day | ORAL | Status: DC | PRN

## 2017-04-28 MED ORDER — DIPHENHYDRAMINE HCL 25 MG PO CAPS
50.0000 mg | ORAL_CAPSULE | ORAL | Status: DC | PRN
Start: 2017-04-28 — End: 2017-05-06

## 2017-04-28 MED ORDER — BUPROPION HCL 75 MG PO TABS
75.0000 mg | ORAL_TABLET | Freq: Two times a day (BID) | ORAL | Status: DC
  Administered 2017-04-28 – 2017-05-05 (×14): 75 mg via ORAL
  Filled 2017-04-28 (×16): qty 1

## 2017-04-28 MED ORDER — ONDANSETRON HCL 4 MG/2ML IJ SOLN: 4.0000 mg | Freq: Four times a day (QID) | INTRAMUSCULAR | Status: DC | PRN

## 2017-04-28 MED ORDER — SODIUM CHLORIDE 0.9 % IV SOLN
INTRAVENOUS | Status: DC
  Administered 2017-04-28: 17:00:00 via INTRAVENOUS

## 2017-04-28 MED ORDER — DIPHENHYDRAMINE HCL 12.5 MG/5ML PO ELIX
12.5000 mg | ORAL_SOLUTION | Freq: Four times a day (QID) | ORAL | Status: DC | PRN
Start: 2017-04-28 — End: 2017-05-01

## 2017-04-28 MED ORDER — SILVER SULFADIAZINE 1 % EX CREA
TOPICAL_CREAM | CUTANEOUS | Status: DC | PRN
  Administered 2017-04-28: 1 via TOPICAL

## 2017-04-28 MED ORDER — HYDROCHLOROTHIAZIDE 12.5 MG PO CAPS
12.5000 mg | ORAL_CAPSULE | Freq: Every day | ORAL | Status: DC
  Administered 2017-04-29 – 2017-05-05 (×6): 12.5 mg via ORAL
  Filled 2017-04-28 (×8): qty 1

## 2017-04-28 MED ORDER — SODIUM CHLORIDE 0.9% FLUSH
9.0000 mL | INTRAVENOUS | Status: DC | PRN
  Administered 2017-05-04 – 2017-05-05 (×2): 9 mL via INTRAVENOUS
  Filled 2017-04-28 (×2): qty 9

## 2017-04-28 MED ORDER — SCOPOLAMINE 1 MG/3DAYS TD PT72
MEDICATED_PATCH | TRANSDERMAL | Status: DC | PRN
  Administered 2017-04-28: 1 via TRANSDERMAL

## 2017-04-28 MED ORDER — SUCCINYLCHOLINE CHLORIDE 200 MG/10ML IV SOSY
PREFILLED_SYRINGE | INTRAVENOUS | Status: AC
  Filled 2017-04-28: qty 10

## 2017-04-28 MED ORDER — DEXAMETHASONE SODIUM PHOSPHATE 10 MG/ML IJ SOLN
INTRAMUSCULAR | Status: AC
  Filled 2017-04-28: qty 1

## 2017-04-28 MED ORDER — LIDOCAINE HCL (CARDIAC) 20 MG/ML IV SOLN
INTRAVENOUS | Status: AC
  Filled 2017-04-28: qty 5

## 2017-04-28 MED ORDER — PIPERACILLIN-TAZOBACTAM 3.375 G IVPB
3.3750 g | Freq: Three times a day (TID) | INTRAVENOUS | Status: AC
  Administered 2017-04-28 – 2017-05-02 (×11): 3.375 g via INTRAVENOUS
  Filled 2017-04-28 (×13): qty 50

## 2017-04-28 MED ORDER — ENOXAPARIN SODIUM 40 MG/0.4ML ~~LOC~~ SOLN
40.0000 mg | SUBCUTANEOUS | Status: DC
  Administered 2017-04-29: 40 mg via SUBCUTANEOUS
  Filled 2017-04-28 (×2): qty 0.4

## 2017-04-28 MED ORDER — METOCLOPRAMIDE HCL 5 MG/ML IJ SOLN: 10.0000 mg | Freq: Four times a day (QID) | INTRAMUSCULAR | Status: DC | PRN

## 2017-04-28 MED ORDER — SODIUM CHLORIDE 0.9% FLUSH: 9.0000 mL | INTRAVENOUS | Status: DC | PRN

## 2017-04-28 MED ORDER — DIPHENHYDRAMINE HCL 12.5 MG/5ML PO ELIX
12.5000 mg | ORAL_SOLUTION | Freq: Four times a day (QID) | ORAL | Status: DC | PRN
Start: 2017-04-28 — End: 2017-04-28

## 2017-04-28 MED ORDER — PROPOFOL 10 MG/ML IV BOLUS
INTRAVENOUS | Status: DC | PRN
  Administered 2017-04-28: 200 mg via INTRAVENOUS

## 2017-04-28 MED ORDER — DEXAMETHASONE SODIUM PHOSPHATE 10 MG/ML IJ SOLN
INTRAMUSCULAR | Status: DC | PRN
  Administered 2017-04-28: 10 mg via INTRAVENOUS

## 2017-04-28 MED ORDER — HYDROMORPHONE 1 MG/ML IV SOLN
INTRAVENOUS | Status: DC
  Administered 2017-04-28: 23:00:00 via INTRAVENOUS
  Administered 2017-04-29: 0.3 mg via INTRAVENOUS
  Administered 2017-04-29: 0 mg via INTRAVENOUS
  Administered 2017-04-30 (×2): 0.9 mg via INTRAVENOUS
  Filled 2017-04-28: qty 25

## 2017-04-28 MED ORDER — FENTANYL CITRATE (PF) 100 MCG/2ML IJ SOLN
INTRAMUSCULAR | Status: DC | PRN
  Administered 2017-04-28 (×2): 50 ug via INTRAVENOUS
  Administered 2017-04-28: 100 ug via INTRAVENOUS
  Administered 2017-04-28: 50 ug via INTRAVENOUS

## 2017-04-28 MED ORDER — LIDOCAINE HCL (CARDIAC) 20 MG/ML IV SOLN
INTRAVENOUS | Status: DC | PRN
  Administered 2017-04-28: 100 mg via INTRAVENOUS

## 2017-04-28 MED ORDER — IBUPROFEN 600 MG PO TABS
600.0000 mg | ORAL_TABLET | Freq: Four times a day (QID) | ORAL | Status: DC | PRN
  Administered 2017-04-29 – 2017-05-05 (×6): 600 mg via ORAL
  Filled 2017-04-28 (×6): qty 1

## 2017-04-28 MED ORDER — OXYCODONE-ACETAMINOPHEN 5-325 MG PO TABS
1.0000 | ORAL_TABLET | ORAL | Status: DC | PRN
  Administered 2017-05-01: 1 via ORAL
  Administered 2017-05-01 – 2017-05-04 (×2): 2 via ORAL
  Administered 2017-05-05 (×3): 1 via ORAL
  Filled 2017-04-28 (×4): qty 1
  Filled 2017-04-28 (×2): qty 2

## 2017-04-28 MED ORDER — ONDANSETRON HCL 4 MG/2ML IJ SOLN
INTRAMUSCULAR | Status: DC | PRN
  Administered 2017-04-28: 4 mg via INTRAVENOUS

## 2017-04-28 MED ORDER — SILVER SULFADIAZINE 1 % EX CREA
TOPICAL_CREAM | CUTANEOUS | Status: AC
  Filled 2017-04-28: qty 85

## 2017-04-28 MED ORDER — HYDROMORPHONE 1 MG/ML IV SOLN: INTRAVENOUS | Status: DC

## 2017-04-28 MED ORDER — LACTATED RINGERS IV SOLN
INTRAVENOUS | Status: DC | PRN
  Administered 2017-04-28 (×2): via INTRAVENOUS

## 2017-04-28 MED ORDER — METRONIDAZOLE IN NACL 5-0.79 MG/ML-% IV SOLN
500.0000 mg | Freq: Four times a day (QID) | INTRAVENOUS | Status: DC
  Administered 2017-04-28 – 2017-05-01 (×9): 500 mg via INTRAVENOUS
  Filled 2017-04-28 (×12): qty 100

## 2017-04-28 MED ORDER — SODIUM CHLORIDE 0.9 % IV SOLN
INTRAVENOUS | Status: DC
  Administered 2017-04-28: 23:00:00 via INTRAVENOUS

## 2017-04-28 MED ORDER — MIDAZOLAM HCL 2 MG/2ML IJ SOLN
INTRAMUSCULAR | Status: DC | PRN
  Administered 2017-04-28: 2 mg via INTRAVENOUS

## 2017-04-28 SURGICAL SUPPLY — 31 items
BINDER ABD UNIV 10 28-50 (GAUZE/BANDAGES/DRESSINGS) ×1 IMPLANT
BINDER ABDOM UNIV 10 (GAUZE/BANDAGES/DRESSINGS) ×2
CATH FOLEY 2WAY SLVR  5CC 18FR (CATHETERS) ×1
CATH FOLEY 2WAY SLVR 5CC 18FR (CATHETERS) ×1 IMPLANT
CATH ROBINSON RED A/P 16FR (CATHETERS) ×2 IMPLANT
ELECT REM PT RETURN 9FT ADLT (ELECTROSURGICAL) ×2
ELECTRODE REM PT RTRN 9FT ADLT (ELECTROSURGICAL) ×1 IMPLANT
GAUZE PACKING 1 X5 YD ST (GAUZE/BANDAGES/DRESSINGS) ×2 IMPLANT
GAUZE PACKING IODOFORM 2 (PACKING) ×2 IMPLANT
GAUZE SPONGE 4X4 12PLY STRL (GAUZE/BANDAGES/DRESSINGS) ×2 IMPLANT
GAUZE SPONGE 4X4 12PLY STRL LF (GAUZE/BANDAGES/DRESSINGS) ×2 IMPLANT
GAUZE SPONGE 4X4 16PLY XRAY LF (GAUZE/BANDAGES/DRESSINGS) ×4 IMPLANT
GLOVE BIO SURGEON STRL SZ7 (GLOVE) ×2 IMPLANT
GLOVE BIOGEL PI IND STRL 7.0 (GLOVE) ×1 IMPLANT
GLOVE BIOGEL PI INDICATOR 7.0 (GLOVE) ×1
GLOVE ECLIPSE 9.0 STRL (GLOVE) ×2 IMPLANT
GLOVE INDICATOR STER SZ 9 (GLOVE) ×2 IMPLANT
GOWN STRL REUS W/ TWL XL LVL3 (GOWN DISPOSABLE) ×1 IMPLANT
GOWN STRL REUS W/TWL XL LVL3 (GOWN DISPOSABLE) ×1
GOWN SURG XXL (GOWNS) ×2 IMPLANT
IV SOD CHL 0.9% 1000ML (IV SOLUTION) ×2 IMPLANT
MASK SURG SENSITIVE SKIN (MASK) ×2 IMPLANT
PACK VAGINAL MINOR WOMEN LF (CUSTOM PROCEDURE TRAY) ×2 IMPLANT
PAD ABD 8X7 1/2 STERILE (GAUZE/BANDAGES/DRESSINGS) ×2 IMPLANT
PENCIL BUTTON HOLSTER BLD 10FT (ELECTRODE) ×2 IMPLANT
SUT PLAIN 2 0 (SUTURE) ×1
SUT PLAIN ABS 2-0 CT1 27XMFL (SUTURE) ×1 IMPLANT
TAPE CLOTH SURG 4X10 WHT LF (GAUZE/BANDAGES/DRESSINGS) ×2 IMPLANT
TOWEL OR 17X24 6PK STRL BLUE (TOWEL DISPOSABLE) ×4 IMPLANT
TUBING NON-CON 1/4 X 20 CONN (TUBING) ×2 IMPLANT
YANKAUER SUCT BULB TIP NO VENT (SUCTIONS) ×2 IMPLANT

## 2017-04-28 NOTE — MAU Provider Note (Signed)
History     CSN: [id number]  Arrival date and time: 04/28/17 1307   First Provider Initiated Contact with Patient 04/28/17 1351      Chief Complaint  Patient presents with  . Vaginal Pain   Beth Green is a 51 y.o. G1P1001 who is SP excision of right vulvar lesion on 04/18/17. She states that about 3 days ago she started to have increased pain, and swelling. She reports that this has increased over the weekend, and at this time she is unable to stand without severe pain. She reports that laying flat is helpful and the pain is more bearable. She has had some pink, blood tinged discharge when the pain started as well.    Vaginal Pain  Primary symptoms comment: vaginal pain after excision of vulvar lesion . This is a new problem. Episode onset: 3 days ago. The problem occurs constantly. The problem has been unchanged. Pain severity now: 9/10  The problem affects the right side. She is not pregnant. The vaginal discharge was white and bloody.   Past Medical History:  Diagnosis Date  . Anxiety   . GAD (generalized anxiety disorder)   . GERD (gastroesophageal reflux disease)   . Head cold   . History of hidradenitis suppurativa   . History of uterine fibroid   . Hypertension   . Vulvar intraepithelial neoplasia (VIN) grade 3     Past Surgical History:  Procedure Laterality Date  . INCISE AND DRAIN ABCESS  08/2016   spider bite (right index finger)  . LAPAROSCOPIC ASSISTED VAGINAL HYSTERECTOMY  2007  . LESION REMOVAL N/A 04/18/2017   Procedure: EXCISION VAGINAL LESION - Vulvar Mass;  Surgeon: Donnamae Jude, MD;  Location: [facility];  Service: Gynecology;  Laterality: N/A;    Family History  Problem Relation Age of Onset  . Hypertension Paternal Grandfather   . Diabetes Father   . Colon cancer Neg Hx   . Stomach cancer Neg Hx   . Esophageal cancer Neg Hx     Social History  Substance Use Topics  . Smoking status: Current Every Day Smoker    Packs/day:  0.25    Years: 25.00    Types: Cigarettes  . Smokeless tobacco: Never Used  . Alcohol use 6.0 oz/week    10 Glasses of wine per week     Comment: occasional    Allergies: No Known Allergies  Prescriptions Prior to Admission  Medication Sig Dispense Refill Last Dose  . Ascorbic Acid (VITAMIN C) 1000 MG tablet Take 1,000 mg by mouth daily.   04/15/2017 at Unknown time  . buPROPion (WELLBUTRIN) 75 MG tablet Take 1 tablet (75 mg total) by mouth 2 (two) times daily. 1/2 tab daily x 5 d, 1/2 tab bid x 5 days then 1 bid (Patient taking differently: Take 75 mg by mouth 2 (two) times daily. ) 60 tablet 2 04/18/2017 at 0630  . chlorhexidine (HIBICLENS) 4 % external liquid Apply topically daily as needed. 120 mL 0 Past Month at Unknown time  . dicyclomine (BENTYL) 10 MG capsule Take 1 capsule (10 mg total) by mouth 3 (three) times daily before meals. 90 capsule 11 never  . diphenhydrAMINE (BENADRYL) 50 MG capsule Take 50 mg by mouth as needed for sleep.   04/17/2017 at Unknown time  . DM-Doxylamine-Acetaminophen (VICKS NYQUIL COLD & FLU NIGHT) 15-6.25-325 MG/15ML LIQD Take by mouth as needed.   04/16/2017 at Unknown time  . lisinopril-hydrochlorothiazide (PRINZIDE,ZESTORETIC) 10-12.5 MG tablet Take 1 tablet  by mouth daily. (Patient taking differently: Take 1 tablet by mouth every morning. ) 90 tablet 1 04/17/2017 at Unknown time  . oxyCODONE-acetaminophen (PERCOCET/ROXICET) 5-325 MG tablet Take 1-2 tablets by mouth every 6 (six) hours as needed. 30 tablet 0   . pantoprazole (PROTONIX) 40 MG tablet Take 1 tablet (40 mg total) by mouth daily with breakfast. 30 tablet 11 never  . ranitidine (ZANTAC) 150 MG tablet Take 150 mg by mouth as needed for heartburn.    04/18/2017 at 0630    Review of Systems  Genitourinary: Positive for vaginal pain.   Physical Exam   Blood pressure (!) 114/59, pulse 98, temperature 99.3 F (37.4 C), resp. rate 16.  Physical Exam  Nursing note and vitals  reviewed. Constitutional: She is oriented to person, place, and time. She appears well-developed and well-nourished. No distress.  HENT:  Head: Normocephalic.  Cardiovascular: Normal rate.   Respiratory: Effort normal.  GI: Soft. There is no tenderness. There is no rebound.  Genitourinary:  Genitourinary Comments: Right labia swollen and erythematous. Erythema and edema extends from the gluteal fold to above the mons. Dr. Glo Herring outlined the area with surgical marker.   Neurological: She is alert and oriented to person, place, and time.  Skin: Skin is warm and dry.  Psychiatric: She has a normal mood and affect.    MAU Course  Procedures  MDM Dr. Glo Herring here to see the patient. Plan for CT, admission for IV abx,   Assessment and Plan  Postoperative infection Admit for broad spectrum antibiotics and CT   Marcille Buffy 04/28/2017, 1:52 PM

## 2017-04-28 NOTE — H&P (Addendum)
Beth Green is an 51 y.o. female. She is now 11 days s/p partial right vulvectomy due to VIN III, who reports 24 hours of malodor and severe vulvar swelling on right, now rated a 10/10 with movement, and "not that bad" when she is still. 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 She denies fever, has had normal Bowel movements. Exam shows diffuse erythema, and a small area of necrosis on lateral aspect of the vulvar repair. Photo documented. See media  Pertinent Gynecological History: Menses:  Bleeding:  Contraception:  DES exposure:  Blood transfusions:  Sexually transmitted diseases:  Previous GYN Procedures:right vulvectomy partial.   Last mammogram:  Date:  Last pap:  Date:  OB History: G1, P1   Menstrual History: Menarche age:  No LMP recorded. Patient has had a hysterectomy.    Past Medical History:  Diagnosis Date  . Anxiety   . GAD (generalized anxiety disorder)   . GERD (gastroesophageal reflux disease)   . Head cold   . History of hidradenitis suppurativa   . History of uterine fibroid   . Hypertension   . Vulvar intraepithelial neoplasia (VIN) grade 3     Past Surgical History:  Procedure Laterality Date  . INCISE AND DRAIN ABCESS  08/2016   spider bite (right index finger)  . LAPAROSCOPIC ASSISTED VAGINAL HYSTERECTOMY  2007  . LESION REMOVAL N/A 04/18/2017   Procedure: EXCISION VAGINAL LESION - Vulvar Mass;  Surgeon: Donnamae Jude, MD;  Location: [place] [facility];  Service: Gynecology;  Laterality: N/A;    Family History  Problem Relation Age of Onset  . Hypertension Paternal Grandfather   . Diabetes Father   . Colon cancer Neg Hx   . Stomach cancer Neg Hx   . Esophageal cancer Neg Hx     Social History:  reports that she has been smoking Cigarettes.  She has a 6.25 pack-year smoking history. She has never used smokeless tobacco. She reports that she drinks about 6.0 oz of alcohol per week . She reports that she does not use  drugs.  Allergies: No Known Allergies  Prescriptions Prior to Admission  Medication Sig Dispense Refill Last Dose  . acetaminophen (TYLENOL) 500 MG tablet Take 1,000 mg by mouth every 6 (six) hours as needed for mild pain or headache.   04/28/2017 at Unknown time  . Ascorbic Acid (VITAMIN C) 1000 MG tablet Take 1,000 mg by mouth daily.   04/27/2017 at Unknown time  . buPROPion (WELLBUTRIN) 75 MG tablet Take 1 tablet (75 mg total) by mouth 2 (two) times daily. 1/2 tab daily x 5 d, 1/2 tab bid x 5 days then 1 bid (Patient taking differently: Take 75 mg by mouth 2 (two) times daily. ) 60 tablet 2 04/28/2017 at Unknown time  . dicyclomine (BENTYL) 10 MG capsule Take 1 capsule (10 mg total) by mouth 3 (three) times daily before meals. 90 capsule 11 04/28/2017 at Unknown time  . diphenhydrAMINE (BENADRYL) 50 MG capsule Take 50 mg by mouth as needed for sleep.   Past Week at Unknown time  . DM-Doxylamine-Acetaminophen (VICKS NYQUIL COLD & FLU NIGHT) 15-6.25-325 MG/15ML LIQD Take 30 mLs by mouth at bedtime as needed (Cold and flu symptoms).    Past Week at Unknown time  . lisinopril-hydrochlorothiazide (PRINZIDE,ZESTORETIC) 10-12.5 MG tablet Take 1 tablet by mouth daily. (Patient taking differently: Take 1 tablet by mouth daily. ) 90 tablet 1 04/28/2017 at Unknown time  . oxyCODONE-acetaminophen (PERCOCET/ROXICET) 5-325 MG tablet Take  1-2 tablets by mouth every 6 (six) hours as needed. (Patient taking differently: Take 1-2 tablets by mouth every 6 (six) hours as needed for moderate pain. ) 30 tablet 0 04/28/2017 at Unknown time  . pantoprazole (PROTONIX) 40 MG tablet Take 1 tablet (40 mg total) by mouth daily with breakfast. 30 tablet 11 04/28/2017 at Unknown time  . chlorhexidine (HIBICLENS) 4 % external liquid Apply topically daily as needed. (Patient not taking: Reported on 04/28/2017) 120 mL 0 Not Taking at Unknown time    ROS  Blood pressure (!) 114/59, pulse 98, temperature 99.3 F (37.4 C), resp. rate  16. Physical Exam  Constitutional: She appears well-developed and well-nourished. She appears distressed.  HENT:  Head: Normocephalic and atraumatic.  Eyes: Pupils are equal, round, and reactive to light.  Neck: Normal range of motion. Neck supple.  Cardiovascular: Normal rate.   Respiratory: Effort normal.  GI: Soft.  Genitourinary:  Genitourinary Comments: Diffuse erythema of mons pubis and down right labia majora with firm induration surrounding the site of surgery, just inferior and to right of urethra. A 1 cm area of superficial necrosis on lateral aspect of surgical site.      Results for orders placed or performed during the hospital encounter of 04/28/17 (from the past 24 hour(s))  Urinalysis, Routine w reflex microscopic     Status: Abnormal   Collection Time: 04/28/17  1:21 PM  Result Value Ref Range   Color, Urine YELLOW YELLOW   APPearance HAZY (A) CLEAR   Specific Gravity, Urine 1.011 1.005 - 1.030   pH 6.0 5.0 - 8.0   Glucose, UA NEGATIVE NEGATIVE mg/dL   Hgb urine dipstick LARGE (A) NEGATIVE   Bilirubin Urine NEGATIVE NEGATIVE   Ketones, ur NEGATIVE NEGATIVE mg/dL   Protein, ur NEGATIVE NEGATIVE mg/dL   Nitrite NEGATIVE NEGATIVE   Leukocytes, UA SMALL (A) NEGATIVE   RBC / HPF TOO NUMEROUS TO COUNT 0 - 5 RBC/hpf   WBC, UA TOO NUMEROUS TO COUNT 0 - 5 WBC/hpf   Bacteria, UA RARE (A) NONE SEEN   Squamous Epithelial / LPF 6-30 (A) NONE SEEN   Mucus PRESENT     No results found.  Assessment/Plan: Vulvar cellulitis, s/p right partial vulvectomy, rule out abscess PLAN: ADMIT             IV antibiotics, Zosyn , Flagyl             CT pelvis w/o contrast              NPO til CT reviewed.              Will start prophylactic Lovenox if not taken to OR              Blood cultures x 2.               Tonnia Bardin V 04/28/2017, 2:53 PM

## 2017-04-28 NOTE — Brief Op Note (Signed)
04/28/2017  10:00 PM  PATIENT:  Taygan Friscia  51 y.o. female  PRE-OPERATIVE DIAGNOSIS:  vulvar abcess necrotizing fasciitis vulva  POST-OPERATIVE DIAGNOSIS:  vulvar abcess and necrotizing fasciitis vulva  PROCEDURE:  Procedure(s): INCISION AND DRAINAGE ABSCESS of Vulva (N/A), becoming a wide area debridement of necrotizing fasciitis  SURGEON:  Surgeon(s) and Role:    Jonnie Kind, MD - Primary  PHYSICIAN ASSISTANT:   ASSISTANTS: none   ANESTHESIA:   general  EBL:  Total I/O In: 1600 [I.V.:1600] Out: 275 [Urine:75; Blood:200]  BLOOD ADMINISTERED:none  DRAINS: Urinary Catheter (Foley)   LOCAL MEDICATIONS USED:  NONE  SPECIMEN:  Source of Specimen:  Aerobic and anaerobic cultures, tissue debridement  DISPOSITION OF SPECIMEN:  PATHOLOGY  COUNTS:  YES  TOURNIQUET:  * No tourniquets in log *  DICTATION: .Dragon Dictation  PLAN OF CARE: Patient has admission orders  PATIENT DISPOSITION:  PACU - hemodynamically stable.   Delay start of Pharmacological VTE agent (>24hrs) due to surgical blood loss or risk of bleeding: not applicable

## 2017-04-28 NOTE — Progress Notes (Signed)
Subjective: Patient reports n change in status. First dose of antibiotics beginning. .    Objective:  CBC    Component Value Date/Time   WBC 20.5 (H) 04/28/2017 1455   RBC 4.36 04/28/2017 1455   HGB 13.8 04/28/2017 1455   HGB 14.8 02/15/2017 1158   HCT 40.5 04/28/2017 1455   HCT 44.0 02/15/2017 1158   PLT 160 04/28/2017 1455   PLT 153 02/15/2017 1158   MCV 92.9 04/28/2017 1455   MCV 92 02/15/2017 1158   MCH 31.7 04/28/2017 1455   MCHC 34.1 04/28/2017 1455   RDW 12.8 04/28/2017 1455   RDW 13.2 02/15/2017 1158   LYMPHSABS 1.7 04/28/2017 1455   MONOABS 0.7 04/28/2017 1455   EOSABS 0.0 04/28/2017 1455   BASOSABS 0.0 04/28/2017 1455   Lactate 1.2   I have reviewed patient's radiology results. Reproductive: Surgically absent.  Other: There is marked edema, inflammation and fluid in the subcutaneous fat involving the suprapubic area, mons pubis, right vulvar region and right perineum with a moderate amount of gas noted. No discrete drainable abscess. Findings worrisome for Fournier gangrene.  Musculoskeletal: No significant bony findings.  IMPRESSION: 1. Findings worrisome for Fournier gangrene involving the mons pubis, suprapubic region, right vulvar area and perineum. 2. No discrete drainable postoperative abscess is identified. 3. No significant intrapelvic or intra-abdominal findings. These results were called by telephone at the time of interpretation on 04/28/2017 at 5:12 pm to Dr. Mallory Shirk , who verbally acknowledged these results.   Electronically Signed   By: Marijo Sanes M.D.   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 Plan: Patient has been kept NPO, and will go to OR this PM for I&D of vulvar abscess/Gangrene. Pt counselled on rationale, recommendations, and reason for prompt surgical drainage.  Will likely need wet-dry  dressings x 2-3 days then consideration of wound vac, if location of I&D will allow wound vac use.  LOS: 0 days    Cheryal Salas V 04/28/2017, 6:04 PM

## 2017-04-28 NOTE — Anesthesia Procedure Notes (Signed)
Procedure Name: LMA Insertion Date/Time: 04/28/2017 7:31 PM Performed by: Riki Sheer Pre-anesthesia Checklist: Patient identified, Emergency Drugs available, Suction available, Patient being monitored and Timeout performed Patient Re-evaluated:Patient Re-evaluated prior to induction Oxygen Delivery Method: Circle system utilized Preoxygenation: Pre-oxygenation with 100% oxygen Induction Type: IV induction Ventilation: Mask ventilation without difficulty LMA: LMA inserted LMA Size: 4.0 Number of attempts: 1 Placement Confirmation: positive ETCO2,  CO2 detector and breath sounds checked- equal and bilateral Tube secured with: Tape Dental Injury: Teeth and Oropharynx as per pre-operative assessment

## 2017-04-28 NOTE — Op Note (Signed)
04/28/2017  10:00 PM  PATIENT:  Beth Green  51 y.o. female  PRE-OPERATIVE DIAGNOSIS:  vulvar abcess necrotizing fasciitis vulva  POST-OPERATIVE DIAGNOSIS:  vulvar abcess and necrotizing fasciitis vulva  PROCEDURE:  Procedure(s): INCISION AND DRAINAGE ABSCESS of Vulva (N/A), becoming a wide area debridement of necrotizing fasciitis  SURGEON:  Surgeon(s) and Role:    Jonnie Kind, MD - Primary  PHYSICIAN ASSISTANT:   ASSISTANTS: none   ANESTHESIA:   general  EBL:  Total I/O In: 1600 [I.V.:1600] Out: 275 [Urine:75; Blood:200]  BLOOD ADMINISTERED:none  DRAINS: Urinary Catheter (Foley)   LOCAL MEDICATIONS USED:  NONE  SPECIMEN:  Source of Specimen:  Aerobic and anaerobic cultures, tissue debridement  DISPOSITION OF SPECIMEN:  PATHOLOGY  COUNTS:  YES  TOURNIQUET:  * No tourniquets in log *  DICTATION: .Dragon Dictation  PLAN OF CARE: Patient has admission orders  PATIENT DISPOSITION:  PACU - hemodynamically stable.   Delay start of Pharmacological VTE agent (>24hrs) due to surgical blood loss or risk of bleeding: not applicable  Indications: 51 year old female presenting 10 days after right labia majora partial vulvectomy for CIN III who presented with swelling and CT confirmed gaseous dissection of right labia majora and mons pubis. Details of procedure: Patient was taken operating room prepped and draped for vulvar procedure with timeout conducted legs in low low lithotomy position, and timeout conducted. The patient had just received her Flagyl and then her Zosyn preoperatively. Timeout was conducted and procedure confirmed. There was distinct anaerobic malodor in the room. Inspection the perineum revealed that the old wound had disrupted, likely recently and could be probed to a depth of approximately 5 cm into the right labia majora. Necrotic tissue was obviously present. There was a 1 cm x 2 cm area on this midportion of the lateral aspects of the original  wound that was necrotic tissue this was dissected free and the underlying necrotic tissue which made at the majority of the fatty tissue of the right mons pubis from the area and equal to the posterior perineal body anterior up into the old surgical field required extensive sharp dissection to dissect and  remove connective tissue that was necrotic the area of necrosis extended anterior chest to the right of the clitoral tissues into the mons pubis. The previous surgical incision was opened fully and dissection of the tissue off of the right mons pubis was performed. It was obvious that we cannot reach at all from the old defect and so a 10 cm long curvilinear incision was made in the right mons pubis and sharply dissected through approximately 2 cm of good healthy appearing tissue identifying a layer of necrotic devascularized deep fatty tissues. Retractors were positioned, Allis clamps near the skin edges to allow visualization and then sharp dissection used to carve out a 10 cm long by 8 cm x 5 cm area of fatty necrosis that extended on the right half of the mons pubis and over to the periclitoral supportive tissues and across the midline approximately 1-1/2 cm into the left mons pubis area layer by layer dissection was performed until we reached bleeding tissue.. Areas were repetitively reinspected to identify cleavage planes that extended along connective tissue that was necrotic into the remaining fatty tissue. At the end we had a bed of relatively supple fatty tissue that did not appear necrotic. Efforts were made to minimize point cautery of small bleeding arteries. At the end of the dissection, the posterior perineal body defect was reinspected and  some additional dissection almost into the right buttock was performed in order to follow the areas of apparent devascularized and necrotic tissue at the end the adjacent tissues were markedly more mobile and could be envisioned is able to be mobilized  sufficiently to help with delayed closure of the wound. Saline gauze was packed in the depth of the incision, 1 inch gauze, and entire role was used to fill the 2 defects sites. There was an approximately 3 cm wide tissue bridge just to the right of the clitoral area that divided the dissection into a superior area and the old wound site area. Form was placed in each. Patient then had dressings applied and ABDs taped in place with Foley catheter in place from the beginning of the surgery to drain the bladder perianal tissues appeared intact and there was no suspicion of the of a perirectal abscess. Was in the left inguinal crease a hair follicle that was inflamed and we were able to express a center core of waxy material which allowed this to decompress more effectively this appeared be a separate process from the exercising fasciitis. Recovering in stable condition with EBL 100 to 200 cc condition to recovery room good the area of tissue just lateral to the original wound site was dusky and will be requiring good oxygenation so the patient will be kept on oxygen per nasal cannula postop .

## 2017-04-28 NOTE — MAU Note (Addendum)
Patient had vulvectomy 10 days ago went to work on Thursday, pubic area swollen and hot to the touch. Having discharge with an odor today.

## 2017-04-28 NOTE — Transfer of Care (Signed)
Immediate Anesthesia Transfer of Care Note  Patient: Beth Green  Procedure(s) Performed: Procedure(s): INCISION AND DRAINAGE ABSCESS of Vulva (N/A)  Patient Location: PACU  Anesthesia Type:General  Level of Consciousness: awake, alert  and oriented  Airway & Oxygen Therapy: Patient Spontanous Breathing and Patient connected to nasal cannula oxygen  Post-op Assessment: Report given to RN and Post -op Vital signs reviewed and stable  Post vital signs: Reviewed and stable  Last Vitals:  Vitals:   04/28/17 1319 04/28/17 1609  BP: (!) 114/59 116/66  Pulse: 98 73  Resp: 16 18  Temp: 37.4 C 37.5 C  SpO2:  99%    Last Pain:  Vitals:   04/28/17 1622  TempSrc:   PainSc: 4       Patients Stated Pain Goal: 4 (61/95/09 3267)  Complications: No apparent anesthesia complications

## 2017-04-28 NOTE — Anesthesia Preprocedure Evaluation (Addendum)
Anesthesia Evaluation  Patient identified by MRN, date of birth, ID band Patient awake    Reviewed: Allergy & Precautions, H&P , NPO status , Patient's Chart, lab work & pertinent test results  History of Anesthesia Complications Negative for: history of anesthetic complications  Airway Mallampati: II   Neck ROM: full    Dental  (+) Teeth Intact, Dental Advisory Given   Pulmonary Current Smoker,    breath sounds clear to auscultation       Cardiovascular hypertension,  Rhythm:regular Rate:Normal     Neuro/Psych PSYCHIATRIC DISORDERS Anxiety    GI/Hepatic GERD  ,  Endo/Other    Renal/GU      Musculoskeletal   Abdominal   Peds  Hematology   Anesthesia Other Findings   Reproductive/Obstetrics                            Anesthesia Physical  Anesthesia Plan  ASA: II and emergent  Anesthesia Plan: General   Post-op Pain Management:    Induction: Intravenous  PONV Risk Score and Plan: 2 and Ondansetron, Dexamethasone, Treatment may vary due to age or medical condition, Diphenhydramine and Scopolamine patch - Pre-op  Airway Management Planned: Oral ETT and LMA  Additional Equipment:   Intra-op Plan:   Post-operative Plan: Extubation in OR  Informed Consent: I have reviewed the patients History and Physical, chart, labs and discussed the procedure including the risks, benefits and alternatives for the proposed anesthesia with the patient or authorized representative who has indicated his/her understanding and acceptance.   Dental advisory given  Plan Discussed with: CRNA, Anesthesiologist and Surgeon  Anesthesia Plan Comments:       Anesthesia Quick Evaluation

## 2017-04-29 ENCOUNTER — Encounter (HOSPITAL_COMMUNITY): Payer: Self-pay | Admitting: Obstetrics and Gynecology

## 2017-04-29 LAB — CREATININE, SERUM
Creatinine, Ser: 0.49 mg/dL (ref 0.44–1.00)
GFR calc Af Amer: >60 mL/min (ref >60)
GFR calc non Af Amer: >60 mL/min (ref >60)

## 2017-04-29 MED ORDER — SODIUM CHLORIDE 0.9 % IV SOLN
INTRAVENOUS | Status: DC
  Administered 2017-04-29 – 2017-04-30 (×2): via INTRAVENOUS

## 2017-04-29 MED ORDER — KETOROLAC TROMETHAMINE 30 MG/ML IJ SOLN
30.0000 mg | Freq: Once | INTRAMUSCULAR | Status: AC
  Administered 2017-04-29: 30 mg via INTRAVENOUS
  Filled 2017-04-29: qty 1

## 2017-04-29 MED ORDER — SODIUM CHLORIDE 0.9 % IV SOLN
Freq: Once | INTRAVENOUS | Status: AC
  Administered 2017-04-29: 07:00:00 via INTRAVENOUS

## 2017-04-29 NOTE — Progress Notes (Signed)
Beth Green RT consulted about providing humidification to pt.

## 2017-04-29 NOTE — Progress Notes (Signed)
Glo Herring MD, and Melody RN-wound care nurse at [facility] to assess incision and preform dressing change.

## 2017-04-29 NOTE — Progress Notes (Signed)
1 Day Post-Op Procedure(s) (LRB): INCISION AND DRAINAGE ABSCESS of Vulva (N/A) dressing change done this morning at 9:30 am.  Subjective: Patient reports incisional pain and tolerating PO.  Foley in place due to difficulty voiding. Pt continues to have a slight cough, is recently s/p URI.   Objective: I have reviewed patient's vital signs, intake and output, medications and labs. Temp:  [97.5 F (36.4 C)-98.2 F (36.8 C)] 97.7 F (36.5 C) (09/24 2042) Pulse Rate:  [72-85] 73 (09/24 2042) Resp:  [14-22] 18 (09/24 2042) BP: (86-105)/(36-62) 102/60 (09/24 2042) SpO2:  [96 %-100 %] 100 % (09/24 2042)   Intake/Output Summary (Last 24 hours) at 04/29/17 2231 Last data filed at 04/29/17 2042  Gross per 24 hour  Intake             1080 ml  Output             1515 ml  Net             -435 ml  Urine output was very low thru the night, and has picked up today. Will go to routine VS.  General: alert, cooperative and moderate distress Resp: clear to auscultation bilaterally and with a few late inspiratory crackles. Cardio: regular rate and rhythm GI: soft, non-tender; bowel sounds normal; no masses,  no organomegaly Incision: saline packing removed and replaced with lightly soaked in betadine /saline mix sterile fluid. The wound is slightly better, but in its base there is a small area of persistent necrotic tissue along the right edge of the clitoral shaft. CBC Latest Ref Rng & Units 04/28/2017 04/16/2017 02/15/2017  WBC 4.0 - 10.5 K/uL 20.5(H) 11.0(H) 7.6  Hemoglobin 12.0 - 15.0 g/dL 13.8 14.4 14.8  Hematocrit 36.0 - 46.0 % 40.5 42.4 44.0  Platelets 150 - 400 K/uL 160 157 153      Assessment: s/p Procedure(s): INCISION AND DRAINAGE ABSCESS of Vulva (N/A): stable and tolerating diet  Plan: Continue Zosyn + Falgyl Will take back to OR Tomorrow afternoon for another debridement, then wet-dry dressings. Will see if vulvar site can ever be allowed to seal back by secondary intention: if  so it miahtbe possible to get a wound vac on the incision on the mons pubis.  LOS: 1 day    Arretta Toenjes V 04/29/2017, 10:30 PM

## 2017-04-29 NOTE — Anesthesia Postprocedure Evaluation (Signed)
Anesthesia Post Note  Patient: Retina Durfey  Procedure(s) Performed: Procedure(s) (LRB): INCISION AND DRAINAGE ABSCESS of Vulva (N/A)     Patient location during evaluation: PACU Anesthesia Type: General Level of consciousness: sedated Pain management: pain level controlled Vital Signs Assessment: post-procedure vital signs reviewed and stable Respiratory status: spontaneous breathing and respiratory function stable Cardiovascular status: stable Postop Assessment: no apparent nausea or vomiting Anesthetic complications: no    Last Vitals:  Vitals:   04/28/17 2326 04/29/17 0002  BP:  (!) 93/53  Pulse:  85  Resp: (!) 22 19  Temp:  36.8 C  SpO2: 96% 98%    Last Pain:  Vitals:   04/29/17 0002  TempSrc: Oral  PainSc:    Pain Goal: Patients Stated Pain Goal: 4 (04/28/17 1622)               Jenah Vanasten DANIEL

## 2017-04-29 NOTE — Progress Notes (Signed)
Patient ID: Beth Green, female   DOB: [birth year], 51 y.o.   MRN: [id number]   Subjective: Interval History: s/p debridement. Packing removed this am with wound care nurse and Dr. Mallory Shirk. Erythema is much improved. Much less induration. Bed of wound has some necrotic tissue still present.  Objective: Vital signs in last 24 hours: Temp:  [97.5 F (36.4 C)-99.4 F (37.4 C)] 97.7 F (36.5 C) (09/24 2042) Pulse Rate:  [72-95] 73 (09/24 2042) Resp:  [14-22] 18 (09/24 2042) BP: (86-105)/(36-69) 102/60 (09/24 2042) SpO2:  [95 %-100 %] 100 % (09/24 2042)  Intake/Output from previous day: 09/23 0701 - 09/24 0700 In: 1725 [I.V.:1725] Out: 1575 [Urine:1375] Intake/Output this shift: Total I/O In: -  Out: 175 [Urine:175]  General appearance: alert, cooperative and appears stated age Lungs: normal effort Heart: regular rate and rhythm Abdomen: soft, non-tender; bowel sounds normal; no masses,  no organomegaly Pelvic: erythema noted on mons and right labia majora with continued erythema and induration, less edema, less induration on mons. Most of base on upper wound is healthy tissue, small area of necrosis noted Wound is repacked from above with packing soaked in Betadine  Results for orders placed or performed during the hospital encounter of 04/28/17 (from the past 24 hour(s))  Creatinine, serum     Status: None   Collection Time: 04/29/17  7:44 AM  Result Value Ref Range   Creatinine, Ser 0.49 0.44 - 1.00 mg/dL   GFR calc non Af Amer >60 >60 mL/min   GFR calc Af Amer >60 >60 mL/min     Scheduled Meds: . buPROPion  75 mg Oral BID  . enoxaparin (LOVENOX) injection  40 mg Subcutaneous Q24H  . hydrochlorothiazide  12.5 mg Oral Daily  . HYDROmorphone   Intravenous Q4H  . lisinopril  10 mg Oral Daily  . pantoprazole  40 mg Oral Q breakfast  . vitamin C  1,000 mg Oral Daily   Continuous Infusions: . sodium chloride 100 mL/hr at 04/29/17 1550  . metronidazole Stopped  (04/29/17 1701)  . piperacillin-tazobactam (ZOSYN)  IV 3.375 g (04/29/17 1713)   PRN Meds:diphenhydrAMINE **OR** diphenhydrAMINE, diphenhydrAMINE, DM-Doxylamine-Acetaminophen, ibuprofen, naloxone **AND** sodium chloride flush, ondansetron (ZOFRAN) IV, oxyCODONE-acetaminophen, polyethylene glycol, simethicone, zolpidem  Assessment/Plan: Principal Problem:   Necrotizing fasciitis ([place]) vulva Active Problems:   Tobacco abuse   Postoperative infection  Improved with more debridement needed. To OR tomorrow for further debridement Continue Zosyn and Flagyl   LOS: 1 day   Donnamae Jude, MD 04/29/2017 9:52 PM

## 2017-04-29 NOTE — Consult Note (Signed)
[facility] Nurse wound consult note Reason for Consult:assistance with postoperative dressing change and advice for wound care Wound type: surgical, mons and vaginal necrotizing fascitis  Pressure Injury POA: No Measurement: see Dr. Johnnye Sima notes from OR Wound bed: mostly clean, some necrotic material in the base of the incision on the mons Drainage (amount, consistency, odor) serosanguinous  Periwound: induration but per surgeon improved, with erythema that has receded based on markings   Dressing procedure/placement/frequency: Betadine moist packing strip for now and short term since the base is still dirty/necrotic until Dr. Glo Herring takes the patient back to the OR, verified in the chart that the patient is not allergic to iodine. Dr. Glo Herring packed site.  Patient using PCA and received Dilaudid bolus and Toradol during dressing change.    WOC will not plan to see patient unless surgeon request.    Re consult if needed, will not follow at this time. Thanks  Malikye Reppond [employer], RN,CWOCN, CNS, [place] [phone])

## 2017-04-30 ENCOUNTER — Inpatient Hospital Stay (HOSPITAL_COMMUNITY): Payer: BLUE CROSS/BLUE SHIELD | Admitting: Anesthesiology

## 2017-04-30 ENCOUNTER — Encounter (HOSPITAL_COMMUNITY): Payer: Self-pay | Admitting: *Deleted

## 2017-04-30 ENCOUNTER — Encounter (HOSPITAL_COMMUNITY)
Admission: AD | Disposition: A | Discharge status: Home / Self Care | Payer: Self-pay | Source: Ambulatory Visit | Attending: Obstetrics and Gynecology

## 2017-04-30 DIAGNOSIS — N9089 Other specified noninflammatory disorders of vulva and perineum: Secondary | ICD-10-CM | POA: Diagnosis not present

## 2017-04-30 DIAGNOSIS — N7689 Other specified inflammation of vagina and vulva: Secondary | ICD-10-CM

## 2017-04-30 HISTORY — PX: INCISION AND DRAINAGE ABSCESS: SHX5864

## 2017-04-30 LAB — CBC WITH DIFFERENTIAL/PLATELET
Basophils Absolute: 0 10*3/uL (ref 0.0–0.1)
Basophils Relative: 0 %
Eosinophils Absolute: 0 10*3/uL (ref 0.0–0.7)
Eosinophils Relative: 0 %
HCT: 30.5 % — ABNORMAL LOW (ref 36.0–46.0)
Hemoglobin: 10.1 g/dL — ABNORMAL LOW (ref 12.0–15.0)
Lymphocytes Relative: 21 %
Lymphs Abs: 2.1 10*3/uL (ref 0.7–4.0)
MCH: 31.5 pg (ref 26.0–34.0)
MCHC: 33.1 g/dL (ref 30.0–36.0)
MCV: 95 fL (ref 78.0–100.0)
Monocytes Absolute: 0.3 10*3/uL (ref 0.1–1.0)
Monocytes Relative: 3 %
Neutro Abs: 7.6 10*3/uL (ref 1.7–7.7)
Neutrophils Relative %: 76 %
Platelets: 156 10*3/uL (ref 150–400)
RBC: 3.21 MIL/uL — ABNORMAL LOW (ref 3.87–5.11)
RDW: 13.2 % (ref 11.5–15.5)
WBC: 9.9 10*3/uL (ref 4.0–10.5)

## 2017-04-30 LAB — COMPREHENSIVE METABOLIC PANEL
ALT: 50 U/L (ref 14–54)
AST: 45 U/L — ABNORMAL HIGH (ref 15–41)
Albumin: 2.7 g/dL — ABNORMAL LOW (ref 3.5–5.0)
Alkaline Phosphatase: 65 U/L (ref 38–126)
Anion gap: 4 — ABNORMAL LOW (ref 5–15)
BUN: 15 mg/dL (ref 6–20)
CO2: 29 mmol/L (ref 22–32)
Calcium: 8.1 mg/dL — ABNORMAL LOW (ref 8.9–10.3)
Chloride: 107 mmol/L (ref 101–111)
Creatinine, Ser: 0.64 mg/dL (ref 0.44–1.00)
GFR calc Af Amer: >60 mL/min (ref >60)
GFR calc non Af Amer: >60 mL/min (ref >60)
Glucose, Bld: 106 mg/dL — ABNORMAL HIGH (ref 65–99)
Potassium: 3.1 mmol/L — ABNORMAL LOW (ref 3.5–5.1)
Sodium: 140 mmol/L (ref 135–145)
Total Bilirubin: 0.6 mg/dL (ref 0.3–1.2)
Total Protein: 5.3 g/dL — ABNORMAL LOW (ref 6.5–8.1)

## 2017-04-30 LAB — SEDIMENTATION RATE: Sed Rate: 55 mm/hr — ABNORMAL HIGH (ref 0–22)

## 2017-04-30 SURGERY — INCISION AND DRAINAGE, ABSCESS
Anesthesia: General | Site: Vagina

## 2017-04-30 MED ORDER — PROPOFOL 10 MG/ML IV BOLUS
INTRAVENOUS | Status: DC | PRN
  Administered 2017-04-30: 200 mg via INTRAVENOUS

## 2017-04-30 MED ORDER — ONDANSETRON HCL 4 MG/2ML IJ SOLN
4.0000 mg | Freq: Once | INTRAMUSCULAR | Status: DC | PRN
Start: 2017-04-30 — End: 2017-04-30

## 2017-04-30 MED ORDER — MEPERIDINE HCL 25 MG/ML IJ SOLN: 6.2500 mg | INTRAMUSCULAR | Status: DC | PRN

## 2017-04-30 MED ORDER — FENTANYL CITRATE (PF) 100 MCG/2ML IJ SOLN
INTRAMUSCULAR | Status: AC
  Administered 2017-04-30: 50 ug via INTRAVENOUS
  Filled 2017-04-30: qty 2

## 2017-04-30 MED ORDER — MIDAZOLAM HCL 2 MG/2ML IJ SOLN
INTRAMUSCULAR | Status: AC
  Filled 2017-04-30: qty 2

## 2017-04-30 MED ORDER — ONDANSETRON HCL 4 MG/2ML IJ SOLN
INTRAMUSCULAR | Status: AC
  Filled 2017-04-30: qty 2

## 2017-04-30 MED ORDER — ONDANSETRON HCL 4 MG/2ML IJ SOLN
INTRAMUSCULAR | Status: DC | PRN
Start: 2017-04-30 — End: 2017-04-30
  Administered 2017-04-30: 4 mg via INTRAVENOUS

## 2017-04-30 MED ORDER — POTASSIUM CHLORIDE 10 MEQ/100ML IV SOLN
10.0000 meq | INTRAVENOUS | Status: AC
  Administered 2017-04-30: 10 meq via INTRAVENOUS
  Filled 2017-04-30 (×2): qty 100

## 2017-04-30 MED ORDER — PROPOFOL 10 MG/ML IV BOLUS
INTRAVENOUS | Status: AC
  Filled 2017-04-30: qty 20

## 2017-04-30 MED ORDER — HYDROMORPHONE HCL 1 MG/ML IJ SOLN
1.0000 mg | INTRAMUSCULAR | Status: DC | PRN
  Administered 2017-04-30: 2 mg via INTRAVENOUS
  Filled 2017-04-30: qty 2

## 2017-04-30 MED ORDER — FENTANYL CITRATE (PF) 250 MCG/5ML IJ SOLN
INTRAMUSCULAR | Status: AC
  Filled 2017-04-30: qty 5

## 2017-04-30 MED ORDER — DEXAMETHASONE SODIUM PHOSPHATE 10 MG/ML IJ SOLN
INTRAMUSCULAR | Status: DC | PRN
  Administered 2017-04-30: 4 mg via INTRAVENOUS

## 2017-04-30 MED ORDER — PHENYLEPHRINE 40 MCG/ML (10ML) SYRINGE FOR IV PUSH (FOR BLOOD PRESSURE SUPPORT)
PREFILLED_SYRINGE | INTRAVENOUS | Status: AC
  Filled 2017-04-30: qty 10

## 2017-04-30 MED ORDER — HYDROMORPHONE HCL 1 MG/ML IJ SOLN
1.0000 mg | INTRAMUSCULAR | Status: DC | PRN
  Administered 2017-05-01 – 2017-05-05 (×5): 1 mg via INTRAVENOUS
  Filled 2017-04-30 (×5): qty 1

## 2017-04-30 MED ORDER — MIDAZOLAM HCL 2 MG/2ML IJ SOLN
INTRAMUSCULAR | Status: DC | PRN
  Administered 2017-04-30: 2 mg via INTRAVENOUS

## 2017-04-30 MED ORDER — LIDOCAINE HCL (CARDIAC) 20 MG/ML IV SOLN
INTRAVENOUS | Status: DC | PRN
  Administered 2017-04-30: 60 mg via INTRAVENOUS

## 2017-04-30 MED ORDER — FENTANYL CITRATE (PF) 100 MCG/2ML IJ SOLN
INTRAMUSCULAR | Status: AC
  Filled 2017-04-30: qty 2

## 2017-04-30 MED ORDER — LACTATED RINGERS IV SOLN
INTRAVENOUS | Status: DC | PRN
  Administered 2017-04-30: 15:00:00 via INTRAVENOUS

## 2017-04-30 MED ORDER — FENTANYL CITRATE (PF) 100 MCG/2ML IJ SOLN
25.0000 ug | INTRAMUSCULAR | Status: DC | PRN
  Administered 2017-04-30 (×2): 50 ug via INTRAVENOUS

## 2017-04-30 MED ORDER — LIDOCAINE HCL (CARDIAC) 20 MG/ML IV SOLN
INTRAVENOUS | Status: AC
  Filled 2017-04-30: qty 5

## 2017-04-30 MED ORDER — KETOROLAC TROMETHAMINE 30 MG/ML IJ SOLN
INTRAMUSCULAR | Status: AC
  Filled 2017-04-30: qty 1

## 2017-04-30 MED ORDER — FENTANYL CITRATE (PF) 100 MCG/2ML IJ SOLN
INTRAMUSCULAR | Status: DC | PRN
  Administered 2017-04-30: 100 ug via INTRAVENOUS
  Administered 2017-04-30: 50 ug via INTRAVENOUS
  Administered 2017-04-30 (×2): 100 ug via INTRAVENOUS

## 2017-04-30 MED ORDER — PHENYLEPHRINE HCL 10 MG/ML IJ SOLN
INTRAMUSCULAR | Status: DC | PRN
  Administered 2017-04-30 (×2): 80 ug via INTRAVENOUS

## 2017-04-30 MED ORDER — SODIUM CHLORIDE 0.9 % IV SOLN
INTRAVENOUS | Status: DC
  Administered 2017-04-30 – 2017-05-04 (×4): via INTRAVENOUS

## 2017-04-30 MED ORDER — KETOROLAC TROMETHAMINE 30 MG/ML IJ SOLN
30.0000 mg | Freq: Four times a day (QID) | INTRAMUSCULAR | Status: AC | PRN
  Administered 2017-04-30 – 2017-05-03 (×5): 30 mg via INTRAVENOUS
  Filled 2017-04-30 (×5): qty 1

## 2017-04-30 MED ORDER — DEXAMETHASONE SODIUM PHOSPHATE 4 MG/ML IJ SOLN
INTRAMUSCULAR | Status: AC
  Filled 2017-04-30: qty 1

## 2017-04-30 SURGICAL SUPPLY — 20 items
BLADE 15 SAFETY STRL DISP (BLADE) ×2 IMPLANT
BLADE SURG CLIPPER 3M 9600 (MISCELLANEOUS) ×2 IMPLANT
DRSG TELFA 3X8 NADH (GAUZE/BANDAGES/DRESSINGS) ×2 IMPLANT
ELECT REM PT RETURN 9FT ADLT (ELECTROSURGICAL) ×2
ELECTRODE REM PT RTRN 9FT ADLT (ELECTROSURGICAL) ×1 IMPLANT
GAUZE PACKING 2X5 YD STRL (GAUZE/BANDAGES/DRESSINGS) ×2 IMPLANT
GAUZE PACKING IODOFORM 2 (PACKING) ×2 IMPLANT
GAUZE SPONGE 4X4 16PLY XRAY LF (GAUZE/BANDAGES/DRESSINGS) ×2 IMPLANT
NS IRRIG 1000ML POUR BTL (IV SOLUTION) ×2 IMPLANT
PACK VAGINAL MINOR WOMEN LF (CUSTOM PROCEDURE TRAY) ×2 IMPLANT
PAD ABD 8X7 1/2 STERILE (GAUZE/BANDAGES/DRESSINGS) ×2 IMPLANT
PAD OB MATERNITY 4.3X12.25 (PERSONAL CARE ITEMS) ×2 IMPLANT
PENCIL BUTTON HOLSTER BLD 10FT (ELECTRODE) ×2 IMPLANT
SUT VIC AB 0 CT1 27 (SUTURE) ×1
SUT VIC AB 0 CT1 27XBRD ANBCTR (SUTURE) ×1 IMPLANT
SUT VIC AB 2-0 CT1 (SUTURE) ×2 IMPLANT
TAPE HYPAFIX 4 X10 (GAUZE/BANDAGES/DRESSINGS) ×2 IMPLANT
TOWEL OR 17X24 6PK STRL BLUE (TOWEL DISPOSABLE) ×2 IMPLANT
TUBING CONNECTING 10 (TUBING) ×2 IMPLANT
YANKAUER SUCT BULB TIP NO VENT (SUCTIONS) ×2 IMPLANT

## 2017-04-30 NOTE — Brief Op Note (Signed)
04/28/2017 - 04/30/2017  4:14 PM  PATIENT:  Beth Green  51 y.o. female  PRE-OPERATIVE DIAGNOSIS:  Vulvar Abscess,, necrotizing fasciitis  POST-OPERATIVE DIAGNOSIS:  vulvar abscess, neck hasn't fasciitis  PROCEDURE:  Wound debridement right vulva and mons pubis  SURGEON:  Surgeon(s) and Role:    Glo Herring, Angelyn Punt, MD - Primary  PHYSICIAN ASSISTANT:   ASSISTANTS: none   ANESTHESIA:   general  EBL:  Total I/O In: 200 [I.V.:200] Out: 535 [Urine:525; Blood:10]  BLOOD ADMINISTERED:none  DRAINS: Urinary Catheter (Foley) , iodoform one half inch gauze placed Neath right labia majora and allowed to exit through a stab incision to the right of the posterior fourchette, 2 inch Kling wet-to-dry dressing placed in the suprapubic wound as wet-to-dry dressing  LOCAL MEDICATIONS USED:  NONE  SPECIMEN:  No Specimen  DISPOSITION OF SPECIMEN:  N/A  COUNTS:  YES  TOURNIQUET:  * No tourniquets in log *  DICTATION: .Dragon Dictation  PLAN OF CARE: Has inpatient admission already  PATIENT DISPOSITION:  PACU - hemodynamically stable.   Delay start of Pharmacological VTE agent (>24hrs) due to surgical blood loss or risk of bleeding: not applicable Details of procedure: Patient was taken operating room prepped and draped, the timeout conducted. Antibiotics were being administered already. Rate is wet-to-dry dressing was removed. The skin looked dramatically improved in terms of erythema, particularly in the mons pubis area. In the vaginal defect there was some additional necrotic tissue at the inferior right side of the defect. Additionally there was a small streak of necrotic tissue along the deep clitoral shaft, warranting debridement. Beginning posteriorly at the right perineal body and we sharply dissected areas of apparent necrotic tissue until we obtained some slightly bleeding tissue edges. The fresh fatty tissue. Viable. The deep periclitoral cleavage plane with associated necrotic  tissue required further dissection and took out 3 or 4 strips of necrotic tissue a couple of centimeters in length each. On the right lateral side of the mons there was an area of hard induration that had malodor when manipulated. This was sharply dissected off and revealed a fascial defect in the anterior abdominal wall about 1 cm wide by 2 cm in length this was irrigated, and after being considered adequately cleaned was reapproximated with 3 interrupted 2-0 Vicryl sutures to prevent a hernia development The wound was further inspected and ovalized sufficiently that it was felt like that the area would close well should we ever be able to a place a wound VAC. Timeout iodoform gauze was placed in the right vaginal and perineal defect and allowed to exit the perineal body through separate stab incision just medial to the gluteal crease, and the mons pubis defect was irrigated, packed with wet to dry Kling dressing and then a dry dressing applied over its surface. Patient to recovery room in stable condition.

## 2017-04-30 NOTE — Op Note (Signed)
Please see the brief operative note for surgical details 

## 2017-04-30 NOTE — Progress Notes (Signed)
Patient ID: Beth Green, female   DOB: 10/20/1965, 51 y.o.   MRN: [id number]   Subjective: Interval History: feels better. She is afebrile. NPO for repeat surgery this am.  Objective: Vital signs in last 24 hours: Temp:  [97.5 F (36.4 C)-98.1 F (36.7 C)] 97.7 F (36.5 C) (09/24 2042) Pulse Rate:  [73-81] 73 (09/24 2042) Resp:  [14-24] 24 (09/25 0601) BP: (86-102)/(36-62) 102/60 (09/24 2042) SpO2:  [98 %-100 %] 99 % (09/25 0601)  Intake/Output from previous day: 09/24 0701 - 09/25 0700 In: 1780 [P.O.:1080; I.V.:700] Out: 1115 [Urine:1115]   BP 102/60 (BP Location: Right Arm)   Pulse 73   Temp 97.7 F (36.5 C) (Oral)   Resp (!) 24   Ht 5\' 8"  (1.727 m)   Wt 176 lb (79.8 kg)   SpO2 99%   BMI 26.76 kg/m  General appearance: alert, cooperative and appears stated age Lungs: normal effort Heart: regular rate and rhythm Abdomen: soft, non-tender; bowel sounds normal; no masses,  no organomegaly Extremities: Homans sign is negative, no sign of DVT Neurologic: Grossly normal  Results for orders placed or performed during the hospital encounter of 04/28/17 (from the past 24 hour(s))  CBC with Differential/Platelet     Status: Abnormal   Collection Time: 04/30/17  6:14 AM  Result Value Ref Range   WBC 9.9 4.0 - 10.5 K/uL   RBC 3.21 (L) 3.87 - 5.11 MIL/uL   Hemoglobin 10.1 (L) 12.0 - 15.0 g/dL   HCT 30.5 (L) 36.0 - 46.0 %   MCV 95.0 78.0 - 100.0 fL   MCH 31.5 26.0 - 34.0 pg   MCHC 33.1 30.0 - 36.0 g/dL   RDW 13.2 11.5 - 15.5 %   Platelets 156 150 - 400 K/uL   Neutrophils Relative % 76 %   Neutro Abs 7.6 1.7 - 7.7 K/uL   Lymphocytes Relative 21 %   Lymphs Abs 2.1 0.7 - 4.0 K/uL   Monocytes Relative 3 %   Monocytes Absolute 0.3 0.1 - 1.0 K/uL   Eosinophils Relative 0 %   Eosinophils Absolute 0.0 0.0 - 0.7 K/uL   Basophils Relative 0 %   Basophils Absolute 0.0 0.0 - 0.1 K/uL  Comprehensive metabolic panel     Status: Abnormal   Collection Time: 04/30/17  6:14 AM  Result  Value Ref Range   Sodium 140 135 - 145 mmol/L   Potassium 3.1 (L) 3.5 - 5.1 mmol/L   Chloride 107 101 - 111 mmol/L   CO2 29 22 - 32 mmol/L   Glucose, Bld 106 (H) 65 - 99 mg/dL   BUN 15 6 - 20 mg/dL   Creatinine, Ser 0.64 0.44 - 1.00 mg/dL   Calcium 8.1 (L) 8.9 - 10.3 mg/dL   Total Protein 5.3 (L) 6.5 - 8.1 g/dL   Albumin 2.7 (L) 3.5 - 5.0 g/dL   AST 45 (H) 15 - 41 U/L   ALT 50 14 - 54 U/L   Alkaline Phosphatase 65 38 - 126 U/L   Total Bilirubin 0.6 0.3 - 1.2 mg/dL   GFR calc non Af Amer >60 >60 mL/min   GFR calc Af Amer >60 >60 mL/min   Anion gap 4 (L) 5 - 15    Scheduled Meds: . buPROPion  75 mg Oral BID  . enoxaparin (LOVENOX) injection  40 mg Subcutaneous Q24H  . hydrochlorothiazide  12.5 mg Oral Daily  . HYDROmorphone   Intravenous Q4H  . lisinopril  10 mg Oral Daily  .  pantoprazole  40 mg Oral Q breakfast  . vitamin C  1,000 mg Oral Daily   Continuous Infusions: . sodium chloride 100 mL/hr at 04/30/17 0500  . metronidazole Stopped (04/30/17 0501)  . piperacillin-tazobactam (ZOSYN)  IV Stopped (04/30/17 0323)   PRN Meds:diphenhydrAMINE **OR** diphenhydrAMINE, diphenhydrAMINE, DM-Doxylamine-Acetaminophen, ibuprofen, naloxone **AND** sodium chloride flush, ondansetron (ZOFRAN) IV, oxyCODONE-acetaminophen, polyethylene glycol, simethicone, zolpidem  Assessment/Plan: Principal Problem:   Necrotizing fasciitis ([place]) vulva Active Problems:   Tobacco abuse   Postoperative infection  WBC is down significantly this am For repeat trip to OR for further debridement today - NPO for this Continue IV Abx.   LOS: 2 days   Donnamae Jude, MD 04/30/2017 7:49 AM

## 2017-04-30 NOTE — Anesthesia Procedure Notes (Signed)
Procedure Name: LMA Insertion Date/Time: 04/30/2017 2:53 PM Performed by: Jonna Munro Pre-anesthesia Checklist: Patient identified, Emergency Drugs available, Suction available, Patient being monitored and Timeout performed Patient Re-evaluated:Patient Re-evaluated prior to induction Oxygen Delivery Method: Circle system utilized Preoxygenation: Pre-oxygenation with 100% oxygen Induction Type: IV induction LMA: LMA inserted LMA Size: 4.0 Number of attempts: 1 Tube secured with: Tape Dental Injury: Teeth and Oropharynx as per pre-operative assessment

## 2017-04-30 NOTE — Care Management Note (Signed)
Case Management Note  Patient Details  Name: Beth Green MRN: [id number] Date of Birth: [birth year]  Subjective/Objective:                   vulvar abcess necrotizing fasciitis vulva          Discharge planning Services  CM Consult   Choice offered to:  Patient   Status of Service:  In process, will continue to follow  Additional Comments: Case Manager met with patient and husband in room and discussed home care after discharge.  Provided List of [facility] to patient and offered choice in preparation of potential need for [facility] at discharge.    Patient and husband want to discuss and talk about it.  Will have CM follow up tomorrow with patient.  Reviewed demographics with patient and patient's address is: [address], [place], [place] [address]; Patient's cell phone is [phone].   CM spoke to [employer] ([place] )RN on phone and she does not think at this time patient will have a wound Vac bc of location of wound but will potentially need [place] for dressing changes.  Patient's is scheduled to to back to the operating room this afternoon around 1400 for another debridement.  CM will continue to follow.  Please call [phone] for needs. Yong Channel, RN 04/30/2017, 12:06 PM

## 2017-04-30 NOTE — Anesthesia Postprocedure Evaluation (Signed)
Anesthesia Post Note  Patient: Beth Green  Procedure(s) Performed: Procedure(s) (LRB): INCISION AND DRAINAGE of Vulvar ABSCESS (N/A)     Patient location during evaluation: PACU Anesthesia Type: General Level of consciousness: awake and alert Pain management: pain level controlled Vital Signs Assessment: post-procedure vital signs reviewed and stable Respiratory status: spontaneous breathing, nonlabored ventilation, respiratory function stable and patient connected to nasal cannula oxygen Cardiovascular status: blood pressure returned to baseline and stable Postop Assessment: no apparent nausea or vomiting Anesthetic complications: no    Last Vitals:  Vitals:   04/30/17 1621 04/30/17 1630  BP: 119/68 123/72  Pulse: 86 81  Resp: 16 16  Temp: 36.8 C   SpO2: 94% 94%    Last Pain:  Vitals:   04/30/17 1630  TempSrc:   PainSc: 0-No pain   Pain Goal: Patients Stated Pain Goal: 3 (04/30/17 0805)               [name]

## 2017-04-30 NOTE — Addendum Note (Signed)
Addendum  created 04/30/17 1905 by Hewitt Blade, CRNA   Sign clinical note

## 2017-04-30 NOTE — Anesthesia Preprocedure Evaluation (Signed)
Anesthesia Evaluation  Patient identified by MRN, date of birth, ID band Patient awake    Reviewed: Allergy & Precautions, H&P , NPO status , Patient's Chart, lab work & pertinent test results  History of Anesthesia Complications Negative for: history of anesthetic complications  Airway Mallampati: II   Neck ROM: full    Dental  (+) Teeth Intact, Dental Advisory Given   Pulmonary Current Smoker,    breath sounds clear to auscultation       Cardiovascular hypertension,  Rhythm:regular Rate:Normal     Neuro/Psych PSYCHIATRIC DISORDERS Anxiety    GI/Hepatic GERD  ,  Endo/Other    Renal/GU      Musculoskeletal   Abdominal   Peds  Hematology   Anesthesia Other Findings   Reproductive/Obstetrics                             Anesthesia Physical  Anesthesia Plan  ASA: II and emergent  Anesthesia Plan: General   Post-op Pain Management:    Induction: Intravenous  PONV Risk Score and Plan: 2 and Ondansetron, Dexamethasone, Treatment may vary due to age or medical condition, Diphenhydramine and Scopolamine patch - Pre-op  Airway Management Planned: Oral ETT and LMA  Additional Equipment:   Intra-op Plan:   Post-operative Plan: Extubation in OR  Informed Consent: I have reviewed the patients History and Physical, chart, labs and discussed the procedure including the risks, benefits and alternatives for the proposed anesthesia with the patient or authorized representative who has indicated his/her understanding and acceptance.   Dental advisory given  Plan Discussed with: CRNA, Anesthesiologist and Surgeon  Anesthesia Plan Comments:         Anesthesia Quick Evaluation

## 2017-04-30 NOTE — Transfer of Care (Signed)
Immediate Anesthesia Transfer of Care Note  Patient: Beth Green  Procedure(s) Performed: Procedure(s): INCISION AND DRAINAGE of Vulvar ABSCESS (N/A)  Patient Location: PACU  Anesthesia Type:General  Level of Consciousness: awake, alert  and oriented  Airway & Oxygen Therapy: Patient Spontanous Breathing and Patient connected to nasal cannula oxygen  Post-op Assessment: Report given to RN and Post -op Vital signs reviewed and stable  Post vital signs: Reviewed and stable  Last Vitals:  Vitals:   04/30/17 0805 04/30/17 1304  BP: (!) 101/52 (!) 104/49  Pulse: 64 75  Resp: 18 18  Temp: 36.8 C 36.7 C  SpO2: 100% 100%    Last Pain:  Vitals:   04/30/17 1304  TempSrc: Oral  PainSc:       Patients Stated Pain Goal: 3 (02/03/15 0109)  Complications: No apparent anesthesia complications

## 2017-04-30 NOTE — Anesthesia Postprocedure Evaluation (Signed)
Anesthesia Post Note  Patient: Beth Green  Procedure(s) Performed: Procedure(s) (LRB): INCISION AND DRAINAGE of Vulvar ABSCESS (N/A)     Patient location during evaluation: [facility] Anesthesia Type: General Level of consciousness: awake and alert and oriented Pain management: pain level controlled Vital Signs Assessment: post-procedure vital signs reviewed and stable Respiratory status: spontaneous breathing, nonlabored ventilation and patient connected to nasal cannula oxygen Cardiovascular status: stable Postop Assessment: no apparent nausea or vomiting and adequate PO intake Anesthetic complications: no    Last Vitals:  Vitals:   04/30/17 1853 04/30/17 1855  BP: 120/71   Pulse: 78   Resp: 16   Temp:    SpO2: 96% 98%    Last Pain:  Vitals:   04/30/17 1851  TempSrc:   PainSc: 5    Pain Goal: Patients Stated Pain Goal: 5 (04/30/17 1851)               Jabier Mutton

## 2017-05-01 ENCOUNTER — Encounter (HOSPITAL_COMMUNITY): Payer: Self-pay | Admitting: Obstetrics and Gynecology

## 2017-05-01 LAB — CBC WITH DIFFERENTIAL/PLATELET
Basophils Absolute: 0 10*3/uL (ref 0.0–0.1)
Basophils Relative: 0 %
Eosinophils Absolute: 0 10*3/uL (ref 0.0–0.7)
Eosinophils Relative: 0 %
HCT: 30.8 % — ABNORMAL LOW (ref 36.0–46.0)
Hemoglobin: 10.2 g/dL — ABNORMAL LOW (ref 12.0–15.0)
Lymphocytes Relative: 15 %
Lymphs Abs: 1.5 10*3/uL (ref 0.7–4.0)
MCH: 31.4 pg (ref 26.0–34.0)
MCHC: 33.1 g/dL (ref 30.0–36.0)
MCV: 94.8 fL (ref 78.0–100.0)
Monocytes Absolute: 0.2 10*3/uL (ref 0.1–1.0)
Monocytes Relative: 2 %
Neutro Abs: 8 10*3/uL — ABNORMAL HIGH (ref 1.7–7.7)
Neutrophils Relative %: 83 %
Platelets: 184 10*3/uL (ref 150–400)
RBC: 3.25 MIL/uL — ABNORMAL LOW (ref 3.87–5.11)
RDW: 13.2 % (ref 11.5–15.5)
WBC: 9.8 10*3/uL (ref 4.0–10.5)

## 2017-05-01 MED ORDER — DOXYCYCLINE HYCLATE 100 MG PO TABS
100.0000 mg | ORAL_TABLET | Freq: Two times a day (BID) | ORAL | Status: DC
  Administered 2017-05-02 – 2017-05-05 (×8): 100 mg via ORAL
  Filled 2017-05-01 (×9): qty 1

## 2017-05-01 MED ORDER — ENOXAPARIN SODIUM 40 MG/0.4ML ~~LOC~~ SOLN
40.0000 mg | SUBCUTANEOUS | Status: DC
  Administered 2017-05-01 – 2017-05-05 (×5): 40 mg via SUBCUTANEOUS
  Filled 2017-05-01 (×6): qty 0.4

## 2017-05-01 MED ORDER — METRONIDAZOLE 500 MG PO TABS
500.0000 mg | ORAL_TABLET | Freq: Two times a day (BID) | ORAL | Status: DC
  Administered 2017-05-01 – 2017-05-05 (×10): 500 mg via ORAL
  Filled 2017-05-01 (×10): qty 1

## 2017-05-01 NOTE — Consult Note (Signed)
WOC contacted CM about HH needs, spoke with Dr. Glo Herring this am and discussed wound care needs moving forward as well as antibiotic coverage.  Dr. Glo Herring will transition all care over to Dr. Kennon Rounds at this time.  CM is aware of the patient's HH needs.     Re consult if needed, will not follow at this time. Thanks  Neita Landrigan [employer], RN,CWOCN, CNS, [place] [phone])

## 2017-05-01 NOTE — Progress Notes (Signed)
1 Day Post-Op Procedure(s) (LRB):Debridement of INCISION AND DRAINAGE of Vulvar ABSCESS (N/A),   Subjective: Patient reports incisional pain, tolerating PO and + BM.  No fever. Had BM last night without significant pain.  Objective:  I have reviewed patient's vital signs, medications and labs. CBC Latest Ref Rng & Units 05/01/2017 04/30/2017 04/28/2017  WBC 4.0 - 10.5 K/uL 9.8 9.9 20.5(H)  Hemoglobin 12.0 - 15.0 g/dL 10.2(L) 10.1(L) 13.8  Hematocrit 36.0 - 46.0 % 30.8(L) 30.5(L) 40.5  Platelets 150 - 400 K/uL 184 156 160    General: alert, cooperative and mild distress GI: soft, non-tender; bowel sounds normal; no masses,  no organomegaly Extremities: Homans sign is negative, no sign of DVT Vulva. Dressing change completed: Saline wet-dry done, without difficulty by myself and Dr Kennon Rounds. Assessment: s/p Procedure(s): INCISION AND DRAINAGE of Vulvar ABSCESS (N/A): stable and progressing well  Plan: 1. Today is last day of IV Zosyn. Begin Doxycycline tomorrow in preparation for home management. 2. Switched to PO Flagyl today. I believe she will be able to go home soon, on outpatient wound care daily dressing changes, with the goal to allow the vaginal opening to seal itself shut, and then switch to Wound Vac to the [facility] area to complete the care. 3 discussed with [facility] wound vac RN [phone], who will speak with case manager today, to have Case Manager present pt with options for Home wound care. Then, after a few days of wet-drys at home, can hopefully be converted to Wound vac to expedite healing. 4 Dr Kennon Rounds will be coordinating care going forward.  LOS: 3 days    Octavia Velador V 05/01/2017, 8:17 AM

## 2017-05-01 NOTE — Care Management Note (Signed)
Case Management Note  Patient Details  Name: Beth Green MRN: [id number] Date of Birth: 1965-11-29  Subjective/Objective:           Vulvar abcess necrotizing fasciitis vulva         Action/Plan: Home with [facility] services for dressing change.  Expected Discharge Date:  05/06/17              Expected Discharge Plan:  [place]  In-House Referral:  NA  Discharge planning Services  CM Consult  Post Acute Care Choice:  Home Health Choice offered to:  Patient  DME Arranged: Negative Pressure Wound Therapy   DME Agency: [place] Arranged:  RN [place] Agency:  [place]  Status of Service: Completed   Additional Comments: 9/26- Case Manager spoke with Pt at the bedside and discussed the options for [facility]'s and the Pt stated that they did not have a preference.  Explained to the Pt that we would make the referral to [place] voiced understanding.  Pt stated that she was unsure of when she would get to go home.  CM notified [facility] Liaison of dc need and they will accept this referral even though the Pt lives in [place].  CM will keep [facility] updated on dc plan. CM will continue to follow.     9/27- CM spoke with the Pt and her Mother in room 307.  CM spoke about having someone to teach to be able to do the dressing changes as the [place] would not be doing this every day.  [facility] will do the dressing change at home initially 2-3 times while teaching someone and then they would follow up periodically as needed.  Pt stated that her Husband, Mother and possibly a friend who is a Nurse would be able to be taught how to do the dressing changes.  CM relayed this information to [facility]. CM will continue to follow.   9/28- Case Manager spoke with Dr. Kennon Rounds and dc plan is for Pt to stay in house until Monday, dc home and have wound vac applied at home on Tuesday by [facility].  [facility] aware of dc plan.  [facility]'s NPWT form left in Pt's shadow chart for MD  to sign and give wound measurements, along with instructions for ordering the home vac.  Pt's Nurse aware of dc plan. CM will follow up on Monday 05/06/17.   10/1- NPWT form completed by attending MD and faxed to [facility].  Pt was been dc'd prior to CM visit today. CM will follow up 05/07/17 to verify that NPWT Device was applied at home by [facility].   10/2-  CM spoke with Santiago Glad at [facility] and the Pt is scheduled to be seen today by the [facility] who will apply the NPWT Device.  Santiago Glad will notify CM if any issues arise.   Dicie Beam [place], [place] 05/01/2017, 12:17 PM

## 2017-05-02 MED ORDER — BUTALBITAL-APAP-CAFFEINE 50-325-40 MG PO TABS
1.0000 | ORAL_TABLET | ORAL | Status: DC | PRN
  Administered 2017-05-02: 1 via ORAL
  Filled 2017-05-02: qty 1

## 2017-05-02 NOTE — Progress Notes (Signed)
UR chart review completed.  

## 2017-05-03 ENCOUNTER — Encounter: Payer: BLUE CROSS/BLUE SHIELD | Admitting: Family Medicine

## 2017-05-03 DIAGNOSIS — M726 Necrotizing fasciitis: Secondary | ICD-10-CM

## 2017-05-03 LAB — CULTURE, BLOOD (ROUTINE X 2)
Culture: NO GROWTH
Culture: NO GROWTH
Special Requests: ADEQUATE
Special Requests: ADEQUATE

## 2017-05-03 LAB — AEROBIC/ANAEROBIC CULTURE W GRAM STAIN (SURGICAL/DEEP WOUND)

## 2017-05-03 MED ORDER — GUAIFENESIN-DM 100-10 MG/5ML PO SYRP
5.0000 mL | ORAL_SOLUTION | ORAL | Status: DC | PRN
  Administered 2017-05-04 (×2): 5 mL via ORAL
  Filled 2017-05-03 (×3): qty 5

## 2017-05-03 NOTE — Progress Notes (Signed)
Patient ID: Beth Green, female   DOB: 1966-07-09, 51 y.o.   MRN: [id number]   Subjective: Interval History:would like to shower. Has noted some nausea in the afternoons.  Objective: Vital signs in last 24 hours: Temp:  [98.5 F (36.9 C)-99.2 F (37.3 C)] 98.5 F (36.9 C) (09/28 1159) Pulse Rate:  [75-82] 82 (09/28 1159) Resp:  [16-18] 18 (09/28 1159) BP: (136-152)/(72-97) 139/97 (09/28 1159) SpO2:  [93 %-98 %] 98 % (09/28 1159)  Intake/Output from previous day: 09/27 0701 - 09/28 0700 In: 50 [I.V.:50] Out: 4700 [Urine:4700] Intake/Output this shift: Total I/O In: -  Out: 1400 [Urine:1400]  General appearance: alert, cooperative and appears stated age Head: Normocephalic, without obvious abnormality, atraumatic Lungs: normal effort Heart: regular rate and rhythm Abdomen: soft, non-tender; bowel sounds normal; no masses,  no organomegaly Extremities: Homans sign is negative, no sign of DVT Skin: wound with healing base on mons. minimal erythema. less labial edema. Packing removed and re-packed without issue   Scheduled Meds: . buPROPion  75 mg Oral BID  . doxycycline  100 mg Oral Q12H  . enoxaparin (LOVENOX) injection  40 mg Subcutaneous Q24H  . hydrochlorothiazide  12.5 mg Oral Daily  . lisinopril  10 mg Oral Daily  . metroNIDAZOLE  500 mg Oral Q12H  . pantoprazole  40 mg Oral Q breakfast  . vitamin C  1,000 mg Oral Daily   Continuous Infusions: . sodium chloride 50 mL/hr at 05/03/17 1040   PRN Meds:butalbital-acetaminophen-caffeine, diphenhydrAMINE, DM-Doxylamine-Acetaminophen, HYDROmorphone (DILAUDID) injection, ibuprofen, ketorolac, naloxone **AND** sodium chloride flush, oxyCODONE-acetaminophen, polyethylene glycol, simethicone, zolpidem  Assessment/Plan: Principal Problem:   Necrotizing fasciitis ([place]) vulva Active Problems:   Tobacco abuse   Postoperative infection  Continue Abx--po for now Continue wound care She remains afebrile and with normal vitals.   Plan for W-->D dressing changes through the weekend, then home with Negative pressure wound vac.   LOS: 5 days   Donnamae Jude, MD 05/03/2017 1:28 PM

## 2017-05-03 NOTE — Progress Notes (Signed)
Patient ID: Cameran Ahmed, female   DOB: 1965/10/04, 51 y.o.   MRN: [id number]   Subjective: Interval History:feels well  Objective: Vital signs in last 24 hours: Temp:  [98.5 F (36.9 C)-99.2 F (37.3 C)] 99.2 F (37.3 C) (09/28 0746) Pulse Rate:  [75-79] 76 (09/28 0746) Resp:  [16] 16 (09/28 0746) BP: (136-153)/(62-81) 137/72 (09/28 0746) SpO2:  [93 %-97 %] 96 % (09/28 0746)  Intake/Output from previous day: 09/27 0701 - 09/28 0700 In: 50 [I.V.:50] Out: 4700 [Urine:4700] Intake/Output this shift: Total I/O In: -  Out: 400 [Urine:400]  General appearance: alert, cooperative and appears stated age Lungs: normal effort Heart: regular rate and rhythm Abdomen: soft, non-tender; bowel sounds normal; no masses,  no organomegaly Extremities: Homans sign is negative, no sign of DVT Skin: no further erythema, base with mild eschar in upper wound--unable to pack from below. Packing changed. Patient tolerated this well.   Scheduled Meds: . buPROPion  75 mg Oral BID  . doxycycline  100 mg Oral Q12H  . enoxaparin (LOVENOX) injection  40 mg Subcutaneous Q24H  . hydrochlorothiazide  12.5 mg Oral Daily  . lisinopril  10 mg Oral Daily  . metroNIDAZOLE  500 mg Oral Q12H  . pantoprazole  40 mg Oral Q breakfast  . vitamin C  1,000 mg Oral Daily   Continuous Infusions: . sodium chloride 50 mL/hr at 05/02/17 1710   PRN Meds:butalbital-acetaminophen-caffeine, diphenhydrAMINE, DM-Doxylamine-Acetaminophen, HYDROmorphone (DILAUDID) injection, ibuprofen, ketorolac, naloxone **AND** sodium chloride flush, oxyCODONE-acetaminophen, polyethylene glycol, simethicone, zolpidem  Assessment/Plan: Principal Problem:   Necrotizing fasciitis ([place]) vulva Active Problems:   Tobacco abuse   Postoperative infection  Continue IV Abx. Continue wet-->dry dressing changes Discussed with Dr. Glo Herring, no further surgery needed--continue packing and arrange home health.   LOS: 5 days   Donnamae Jude,  MD 05/03/2017 8:14 AM

## 2017-05-04 MED ORDER — PHENAZOPYRIDINE HCL 100 MG PO TABS
200.0000 mg | ORAL_TABLET | Freq: Three times a day (TID) | ORAL | Status: DC
  Administered 2017-05-04 – 2017-05-06 (×3): 200 mg via ORAL
  Filled 2017-05-04 (×6): qty 2

## 2017-05-04 NOTE — Progress Notes (Signed)
Foley cath care completed.

## 2017-05-04 NOTE — Progress Notes (Signed)
   Subjective: Interval History:washed her hair. Having some burning with urination. Has an indwelling foley.  Objective: Vital signs in last 24 hours: Temp:  [97.9 F (36.6 C)-98.5 F (36.9 C)] 98.2 F (36.8 C) (09/29 1235) Pulse Rate:  [60-80] 60 (09/29 1235) Resp:  [18] 18 (09/29 1235) BP: (113-140)/(69-79) 140/79 (09/29 1235) SpO2:  [96 %-99 %] 96 % (09/29 1235)  Intake/Output from previous day: 09/28 0701 - 09/29 0700 In: -  Out: 2350 [Urine:2350] Intake/Output this shift: Total I/O In: -  Out: 1000 [Urine:1000]  General appearance: alert, cooperative and appears stated age Lungs: normal effort Heart: regular rate and rhythm Abdomen: soft, non-tender; bowel sounds normal; no masses,  no organomegaly Extremities: Homans sign is negative, no sign of DVT Skin: clean base. Packing changed. No erythema, ? tape reaction  Scheduled Meds: . buPROPion  75 mg Oral BID  . doxycycline  100 mg Oral Q12H  . enoxaparin (LOVENOX) injection  40 mg Subcutaneous Q24H  . hydrochlorothiazide  12.5 mg Oral Daily  . lisinopril  10 mg Oral Daily  . metroNIDAZOLE  500 mg Oral Q12H  . pantoprazole  40 mg Oral Q breakfast  . phenazopyridine  200 mg Oral TID WC  . vitamin C  1,000 mg Oral Daily   Continuous Infusions: . sodium chloride 50 mL/hr at 05/04/17 0719   PRN Meds:butalbital-acetaminophen-caffeine, diphenhydrAMINE, DM-Doxylamine-Acetaminophen, guaiFENesin-dextromethorphan, HYDROmorphone (DILAUDID) injection, ibuprofen, ketorolac, naloxone **AND** sodium chloride flush, oxyCODONE-acetaminophen, polyethylene glycol, simethicone, zolpidem  Assessment/Plan: Principal Problem:   Necrotizing fasciitis ([facility]) vulva Active Problems:   Tobacco abuse   Postoperative infection   New onset burning at catheter site   Continue po Abx W-->D dressing changes Pyridium D/C foley Saline lock IV Check U/A if symptoms persist.  LOS: 6 days   Donnamae Jude, MD 05/04/2017 4:11 PM

## 2017-05-05 LAB — CREATININE, SERUM
Creatinine, Ser: 0.58 mg/dL (ref 0.44–1.00)
GFR calc Af Amer: >60 mL/min (ref >60)
GFR calc non Af Amer: >60 mL/min (ref >60)

## 2017-05-05 NOTE — Plan of Care (Signed)
Problem: Skin Integrity: Goal: Demonstration of wound healing without infection will improve Outcome: Progressing Dr Kennon Rounds to complete daily dressing changes. Wound remains free from drainage at this time.  Pt provided wipes for perineal care.

## 2017-05-05 NOTE — Progress Notes (Signed)
Patient ID: Beth Green, female   DOB: Mar 20, 1966, 51 y.o.   MRN: [id number]   Subjective: Interval History:Patient feels better today than she has.  Objective: Vital signs in last 24 hours: Temp:  [98 F (36.7 C)-98.7 F (37.1 C)] 98 F (36.7 C) (09/30 0800) Pulse Rate:  [60-84] 84 (09/30 0800) Resp:  [16-18] 18 (09/30 0800) BP: (104-146)/(55-85) 105/85 (09/30 0800) SpO2:  [95 %-100 %] 98 % (09/30 0800)  Intake/Output from previous day: 09/29 0701 - 09/30 0700 In: -  Out: 1450 [Urine:1450]  BP 105/85 (BP Location: Right Arm)   Pulse 84   Temp 98 F (36.7 C) (Oral)   Resp 18   Ht 5\' 8"  (1.727 m)   Wt 176 lb (79.8 kg)   SpO2 98%   BMI 26.76 kg/m  General appearance: alert, cooperative and appears stated age Lungs: normal effort Heart: regular rate and rhythm Extremities: Homans sign is negative, no sign of DVT Skin: base is clear, no erythema, wound packed with iodoform  Results for orders placed or performed during the hospital encounter of 04/28/17 (from the past 24 hour(s))  Creatinine, serum     Status: None   Collection Time: 05/05/17  5:21 AM  Result Value Ref Range   Creatinine, Ser 0.58 0.44 - 1.00 mg/dL   GFR calc non Af Amer >60 >60 mL/min   GFR calc Af Amer >60 >60 mL/min    Studies/Results:  Scheduled Meds: . buPROPion  75 mg Oral BID  . doxycycline  100 mg Oral Q12H  . enoxaparin (LOVENOX) injection  40 mg Subcutaneous Q24H  . hydrochlorothiazide  12.5 mg Oral Daily  . lisinopril  10 mg Oral Daily  . metroNIDAZOLE  500 mg Oral Q12H  . pantoprazole  40 mg Oral Q breakfast  . phenazopyridine  200 mg Oral TID WC  . vitamin C  1,000 mg Oral Daily   Continuous Infusions: PRN Meds:butalbital-acetaminophen-caffeine, diphenhydrAMINE, DM-Doxylamine-Acetaminophen, guaiFENesin-dextromethorphan, HYDROmorphone (DILAUDID) injection, ibuprofen, ketorolac, naloxone **AND** sodium chloride flush, oxyCODONE-acetaminophen, polyethylene glycol, simethicone,  zolpidem  Assessment/Plan: Principal Problem:   Necrotizing fasciitis ([place]) vulva Active Problems:   Tobacco abuse   Postoperative infection  Continue Abx W-->D dressing changes Home tomorrow.   LOS: 7 days   Donnamae Jude, MD 05/05/2017 11:14 AM

## 2017-05-06 MED ORDER — DOXYCYCLINE HYCLATE 100 MG PO TABS: 100.0000 mg | ORAL_TABLET | Freq: Two times a day (BID) | ORAL | 0 refills | Status: DC

## 2017-05-06 MED ORDER — METRONIDAZOLE 500 MG PO TABS: 500.0000 mg | ORAL_TABLET | Freq: Two times a day (BID) | ORAL | 0 refills | Status: DC

## 2017-05-06 MED ORDER — OXYCODONE-ACETAMINOPHEN 5-325 MG PO TABS: 1.0000 | ORAL_TABLET | Freq: Four times a day (QID) | ORAL | 0 refills | Status: DC | PRN

## 2017-05-06 NOTE — Progress Notes (Signed)
Discharge teaching complete. Pt understood all information and did not have any questions. Home health to go to pt's house tomorrow and place wound vac. Pt discharge home to family.

## 2017-05-06 NOTE — Discharge Summary (Signed)
Physician Discharge Summary  Patient ID: Beth Green MRN: [id number] DOB/AGE: Jun 06, 1966 51 y.o.  Admit date: 04/28/2017 Discharge date: 05/06/2017   Discharge Diagnoses:  Principal Problem:   Necrotizing fasciitis ([place]) vulva Active Problems:   Tobacco abuse   Postoperative infection   Consults: None  Significant Diagnostic Studies:   Ct Abdomen Pelvis W Contrast  Result Date: 04/28/2017 CLINICAL DATA:  History of surgery 11 days ago 4 partial right vulvectomy. Severe right vulvar swelling. EXAM: CT ABDOMEN AND PELVIS WITH CONTRAST TECHNIQUE: Multidetector CT imaging of the abdomen and pelvis was performed using the standard protocol following bolus administration of intravenous contrast. CONTRAST:  162mL ISOVUE-300 IOPAMIDOL (ISOVUE-300) INJECTION 61% COMPARISON:  None. FINDINGS: Lower chest: The lung bases are clear of acute process. No pleural effusion or pulmonary lesions. The heart is normal in size. No pericardial effusion. The distal esophagus and aorta are unremarkable. Hepatobiliary: Diffuse fatty infiltration of the liver but no focal hepatic lesions or intrahepatic biliary dilatation. The gallbladder is normal. No common bile duct dilatation. Pancreas: No mass, inflammation or ductal dilatation. Spleen: Normal size.  No focal lesions. Adrenals/Urinary Tract: The adrenal glands and kidneys are unremarkable. No ureteral or bladder calculi. No renal or bladder mass. Stomach/Bowel: The stomach, duodenum, small bowel and colon are grossly normal. No inflammatory changes, mass lesions or obstructive findings. The terminal ileum and appendix are normal. Vascular/Lymphatic: The aorta is normal in caliber. No dissection. Scattered atherosclerotic calcifications. The branch vessels are patent. The major venous structures are patent. No mesenteric or retroperitoneal mass or adenopathy. Small scattered lymph nodes are noted. Reproductive: Surgically absent. Other: There is marked edema,  inflammation and fluid in the subcutaneous fat involving the suprapubic area, mons pubis, right vulvar region and right perineum with a moderate amount of gas noted. No discrete drainable abscess. Findings worrisome for Fournier gangrene. Musculoskeletal: No significant bony findings. IMPRESSION: 1. Findings worrisome for Fournier gangrene involving the mons pubis, suprapubic region, right vulvar area and perineum. 2. No discrete drainable postoperative abscess is identified. 3. No significant intrapelvic or intra-abdominal findings. These results were called by telephone at the time of interpretation on 04/28/2017 at 5:12 pm to Dr. Mallory Shirk , who verbally acknowledged these results. Electronically Signed   By: Marijo Sanes M.D.   On: 04/28/2017 17:12   CBC Latest Ref Rng & Units 05/01/2017 04/30/2017 04/28/2017  WBC 4.0 - 10.5 K/uL 9.8 9.9 20.5(H)  Hemoglobin 12.0 - 15.0 g/dL 10.2(L) 10.1(L) 13.8  Hematocrit 36.0 - 46.0 % 30.8(L) 30.5(L) 40.5  Platelets 150 - 400 K/uL 184 156 160   Sed Rate 0 - 22 mm/hr 55     Sodium 135 - 145 mmol/L 140   Potassium 3.5 - 5.1 mmol/L 3.1    Chloride 101 - 111 mmol/L 107   CO2 22 - 32 mmol/L 29   Glucose, Bld 65 - 99 mg/dL 106    BUN 6 - 20 mg/dL 15   Creatinine, Ser 0.44 - 1.00 mg/dL 0.64   Calcium 8.9 - 10.3 mg/dL 8.1    Total Protein 6.5 - 8.1 g/dL 5.3    Albumin 3.5 - 5.0 g/dL 2.7    AST 15 - 41 U/L 45    ALT 14 - 54 U/L 50   Alkaline Phosphatase 38 - 126 U/L 65   Total Bilirubin 0.3 - 1.2 mg/dL 0.6   GFR calc non Af Amer >60 mL/min >60   GFR calc Af Amer >60 mL/min >60    Specimen Description ABSCESS  VULVA  Performed at [facility], [place] [address], [place], [place] [address]      Special Requests NONE  Performed at [facility], [place] [address] [place], [place] [address]      Gram Stain ABUNDANT WBC PRESENT, PREDOMINANTLY PMN  ABUNDANT GRAM NEGATIVE RODS  ABUNDANT GRAM POSITIVE COCCI IN PAIRS  Performed  at [facility], [place] [address] [place], [place] [address]      Culture   MULTIPLE ORGANISMS PRESENT, NONE PREDOMINANT  MIXED ANAEROBIC FLORA PRESENT. CALL LAB IF FURTHER IID REQUIRED.         Discharge exam: Blood pressure 113/77, pulse 71, temperature 97.9 F (36.6 C), temperature source Oral, resp. rate 15, height 5\' 8"  (1.727 m), weight 176 lb (79.8 kg), SpO2 94 %. Physical Examination: General appearance - alert, well appearing, and in no distress Chest - normal effort Abdomen - soft, nontender, nondistended, no masses or organomegaly Extremities - Homan's sign negative bilaterally Skin - base is clean and there is no surrounding erythema--packing changed   Hospital Course: Patient admitted with vulvar infection. Taken to OR due to CT findings c/w necrotizing fasciitis. Please see Op notes for full details of procedure. Debrided and cleaned. Went back to OR for further debridement on HD #2 with much less necrotic tissue. WBC improved. Remained afebrile. Wet to dry dressing changes done daily. On IV Abx, switched to PO Abx on HD #3. Made steady progress. Able to ambulate, void and tolerate po. Deemed stable for discharge with home health to place wound vac on day following discharge.  Disposition: 01-Home or Self Care  Discharged Condition: improved  Discharge Instructions    Call MD for:  difficulty breathing, headache or visual disturbances    Complete by:  As directed    Call MD for:  redness, tenderness, or signs of infection (pain, swelling, redness, odor or green/yellow discharge around incision site)    Complete by:  As directed    Call MD for:  severe uncontrolled pain    Complete by:  As directed    Call MD for:  temperature >100.4    Complete by:  As directed    Diet - low sodium heart healthy    Complete by:  As directed    Driving Restrictions    Complete by:  As directed    If needing narcotics   Increase activity slowly    Complete by:  As directed      Sexual Activity Restrictions    Complete by:  As directed    None until f/u     Allergies as of 05/06/2017   No Known Allergies     Medication List    TAKE these medications   acetaminophen 500 MG tablet Commonly known as:  TYLENOL Take 1,000 mg by mouth every 6 (six) hours as needed for mild pain or headache.   buPROPion 75 MG tablet Commonly known as:  WELLBUTRIN Take 1 tablet (75 mg total) by mouth 2 (two) times daily. 1/2 tab daily x 5 d, 1/2 tab bid x 5 days then 1 bid What changed:  additional instructions   chlorhexidine 4 % external liquid Commonly known as:  HIBICLENS Apply topically daily as needed.   dicyclomine 10 MG capsule Commonly known as:  BENTYL Take 1 capsule (10 mg total) by mouth 3 (three) times daily before meals.   diphenhydrAMINE 50 MG capsule Commonly known as:  BENADRYL Take 50 mg by mouth as needed  for sleep.   doxycycline 100 MG tablet Commonly known as:  VIBRA-TABS Take 1 tablet (100 mg total) by mouth every 12 (twelve) hours.   lisinopril-hydrochlorothiazide 10-12.5 MG tablet Commonly known as:  PRINZIDE,ZESTORETIC Take 1 tablet by mouth daily.   metroNIDAZOLE 500 MG tablet Commonly known as:  FLAGYL Take 1 tablet (500 mg total) by mouth every 12 (twelve) hours.   oxyCODONE-acetaminophen 5-325 MG tablet Commonly known as:  PERCOCET/ROXICET Take 1-2 tablets by mouth every 6 (six) hours as needed. What changed:  reasons to take this   oxyCODONE-acetaminophen 5-325 MG tablet Commonly known as:  PERCOCET/ROXICET Take 1-2 tablets by mouth every 6 (six) hours as needed. What changed:  You were already taking a medication with the same name, and this prescription was added. Make sure you understand how and when to take each.   pantoprazole 40 MG tablet Commonly known as:  PROTONIX Take 1 tablet (40 mg total) by mouth daily with breakfast.   VICKS NYQUIL COLD & FLU NIGHT 15-6.25-325 MG/15ML Liqd Generic drug:   DM-Doxylamine-Acetaminophen Take 30 mLs by mouth at bedtime as needed (Cold and flu symptoms).   vitamin C 1000 MG tablet Take 1,000 mg by mouth daily.            Discharge Care Instructions        Start     Ordered   05/06/17 0000  doxycycline (VIBRA-TABS) 100 MG tablet  Every 12 hours     05/06/17 0758   05/06/17 0000  metroNIDAZOLE (FLAGYL) 500 MG tablet  Every 12 hours     05/06/17 0758   05/06/17 0000  Increase activity slowly     05/06/17 0758   05/06/17 0000  Diet - low sodium heart healthy     05/06/17 0758   05/06/17 0000  Driving Restrictions    Comments:  If needing narcotics   05/06/17 0758   05/06/17 0000  Sexual Activity Restrictions    Comments:  None until f/u   05/06/17 0758   05/06/17 0000  Call MD for:  temperature >100.4     05/06/17 0758   05/06/17 0000  Call MD for:  severe uncontrolled pain     05/06/17 0758   05/06/17 0000  Call MD for:  redness, tenderness, or signs of infection (pain, swelling, redness, odor or green/yellow discharge around incision site)     05/06/17 0758   05/06/17 0000  Call MD for:  difficulty breathing, headache or visual disturbances     05/06/17 0758   05/06/17 0000  oxyCODONE-acetaminophen (PERCOCET/ROXICET) 5-325 MG tablet  Every 6 hours PRN     05/06/17 [place] for [place] at [facility] Follow up in 1 week(s).   Specialty:  Obstetrics and Gynecology Why:  they will call you with apointment Contact information: [place] [phone]          Signed: Donnamae Jude 05/06/2017, 8:00 AM

## 2017-05-06 NOTE — Discharge Instructions (Signed)
Necrotizing Fasciitis Necrotizing fasciitis is a severe bacterial infection of the skin and the tissues underneath the skin. The bacteria that cause this infection have been called flesh-eating bacteria because the infection can quickly kill flesh around the infection. Necrotizing fasciitis is very rare in people who have a normal disease-fighting (immune) system. It most often affects people who have a weak immune system. This condition is a medical emergency that must be treated quickly. Untreated necrotizing fasciitis can be life-threatening. What are the causes? This infection is caused by bacteria. There may be one type of bacteria or a combination of bacteria. Streptococcus and staphylococcus are common types of bacteria that can cause this infection. These bacteria are often found on the skin. In most cases, bacteria enter the tissue under the skin through a:  Cut.  Scrape.  Surgical incision.  Insect bite.  Needle puncture.  Once inside the body, the infection spreads along tissue that covers the muscles (fascia). The bacteria produce poisons (toxins) that kill tissues as the infection spreads. This reduces the blood supply to the surrounding tissues and causes more cell death. This makes it hard for the immune system to fight the infection. What increases the risk? You may have a higher risk of this condition if you:  Have a weak immune system.  Are of advanced age.  Have another medical condition, such as: ? Diabetes. ? Cancer. ? Kidney disease. ? Liver disease. ? A blood vessel disease. ? HIV (human immunodeficiency virus) or AIDS (acquired immunodeficiency syndrome). ? Lymphedema.  Have recently had surgery.  Are obese.  Abuse drugs or alcohol.  What are the signs or symptoms? Symptoms start quickly. Severe pain is the main symptom. Other early signs and symptoms may include:  Redness and warmth.  Flu-like symptoms.  Skin color changing from red to purple,  and then to dark spots.  Swelling that makes the skin feel hard (hard swelling).  Crackling noise when pressing on the skin (crepitus).  High fever and chills.  Vomiting.  As the condition gets worse, these signs and symptoms may develop:  Blisters, ulcers, or splitting of the skin.  Drainage of pus and fluid.  Rapid breathing.  Sudden decrease in pain.  Confusion.  Loss of consciousness.  How is this diagnosed? It is important to diagnose this condition as soon as possible. This condition may be diagnosed based on:  Surgery to open the infected area and check for tissue death (surgical exploration).This is often the most important part of diagnosis. During surgical exploration, samples of tissue may be taken, and dead tissue may be removed.  Your symptoms.  Your medical history.  A physical exam.  Blood tests.  Imaging tests, such as: ? X-rays. ? Ultrasound. ? CT scan. ? MRI.  Taking fluid samples of drainage from the skin or underneath the skin to test for the type of bacteria that may be causing the infection (cultures).  How is this treated? This condition is treated as an emergency at the hospital. Treatment may include:  Fluids and antibiotic medicines given through a vein.  Medicines to support blood pressure.  Surgery to open the skin and remove dead or dying tissue. This may need to be repeated until your condition improves. Surgical incisions may be packed with gauze and left open until the infection is controlled. After the infection has been controlled, plastic or reconstructive surgery may be done to close the incisions with skin from another area of your body (graft).  Follow these instructions  at home:  Take your antibiotic medicine as told by your health care provider. Do not stop taking the antibiotic even if you start to feel better or your condition improves.  Do not use any tobacco products, such as cigarettes, chewing tobacco, and  e-cigarettes. If you need help quitting, ask your health care provider.  Take over-the-counter and prescription medicines only as told by your health care provider.  Return to your normal activities as told by your health care provider. Ask your health care provider what activities are safe for you.  Keep all follow-up visits as told by your health care provider. This is important. How is this prevented?  Wash your hands frequently with soap and warm water. If soap and water are not available, use hand sanitizer.  Wash broken skin with soap and water and cover it with a clean, dry bandage until healed.  Use germ-killing cream or ointment on cuts and sores.  Do not go in hot tubs, swimming pools, lakes, or ponds if you have an open cut. Contact a health care provider if:  You have a fever or chills.  You have a wound that develops signs of infection, such as: ? Redness, swelling, or pain. ? Warmth. ? A bad smell. ? Fluid, blood, or pus. Get help right away if:  You have severe pain.  Your skin changes color.  You develop hard swelling.  You have a wound that splits open. This information is not intended to replace advice given to you by your health care provider. Make sure you discuss any questions you have with your health care provider. Document Released: 09/22/2015 Document Revised: 01/03/2016 Document Reviewed: 09/22/2015 Elsevier Interactive Patient Education  [employer].

## 2017-05-07 ENCOUNTER — Encounter: Payer: Self-pay | Admitting: *Deleted

## 2017-05-12 ENCOUNTER — Other Ambulatory Visit: Payer: Self-pay | Admitting: Family Medicine

## 2017-05-12 DIAGNOSIS — F411 Generalized anxiety disorder: Secondary | ICD-10-CM

## 2017-05-15 ENCOUNTER — Ambulatory Visit (INDEPENDENT_AMBULATORY_CARE_PROVIDER_SITE_OTHER): Payer: BLUE CROSS/BLUE SHIELD | Admitting: Family Medicine

## 2017-05-15 ENCOUNTER — Encounter: Payer: Self-pay | Admitting: Family Medicine

## 2017-05-15 DIAGNOSIS — F411 Generalized anxiety disorder: Secondary | ICD-10-CM

## 2017-05-15 DIAGNOSIS — M726 Necrotizing fasciitis: Secondary | ICD-10-CM

## 2017-05-15 MED ORDER — BUPROPION HCL 75 MG PO TABS: ORAL_TABLET | ORAL | 2 refills | Status: DC

## 2017-05-15 NOTE — Assessment & Plan Note (Signed)
Healing well--continue NPWT with home health. Return for re-check in 2 wks.

## 2017-05-15 NOTE — Progress Notes (Signed)
   Subjective:    Patient ID: Beth Green is a 51 y.o. female presenting with Post-op Follow-up  on 05/15/2017  HPI: S/p WLE of VIN3. She then developed necrotizing fasciitis. In hospital x 1 wk. On Abx. S/p debridement x 2. Now with wound vac. Has some numbness left in labia.  Review of Systems  Constitutional: Negative for chills and fever.  Respiratory: Negative for shortness of breath.   Cardiovascular: Negative for chest pain.  Gastrointestinal: Negative for abdominal pain, nausea and vomiting.  Genitourinary: Negative for dysuria.  Skin: Negative for rash.      Objective:    BP 129/88   Pulse (!) 101   Wt 177 lb (80.3 kg)   BMI 26.91 kg/m  Physical Exam  Constitutional: She is oriented to person, place, and time. She appears well-developed and well-nourished. No distress.  HENT:  Head: Normocephalic and atraumatic.  Eyes: No scleral icterus.  Neck: Neck supple.  Cardiovascular: Normal rate.   Pulmonary/Chest: Effort normal.  Abdominal: Soft.  Genitourinary:  Genitourinary Comments: No mons erythema. Still with some mild induration of right labia majora. Base with some purulence noted.  Neurological: She is alert and oriented to person, place, and time.  Skin: Skin is warm and dry.  Psychiatric: She has a normal mood and affect.        Assessment & Plan:   Problem List Items Addressed This Visit      Unprioritized   Generalized anxiety disorder   Relevant Medications   buPROPion (WELLBUTRIN) 75 MG tablet   Necrotizing fasciitis ([place]) vulva    Healing well--continue NPWT with home health. Return for re-check in 2 wks.         Total face-to-face time with patient: 15 minutes. Over 50% of encounter was spent on counseling and coordination of care. Return in about 2 weeks (around 05/29/2017).  Donnamae Jude 05/15/2017 5:26 PM

## 2017-05-20 ENCOUNTER — Encounter: Payer: Self-pay | Admitting: Family Medicine

## 2017-05-20 ENCOUNTER — Encounter: Payer: Self-pay | Admitting: *Deleted

## 2017-05-22 ENCOUNTER — Encounter (INDEPENDENT_AMBULATORY_CARE_PROVIDER_SITE_OTHER): Payer: Self-pay | Admitting: *Deleted

## 2017-05-22 DIAGNOSIS — Z029 Encounter for administrative examinations, unspecified: Secondary | ICD-10-CM

## 2017-05-29 ENCOUNTER — Encounter: Payer: Self-pay | Admitting: Radiology

## 2017-05-29 ENCOUNTER — Encounter: Payer: Self-pay | Admitting: Family Medicine

## 2017-05-29 ENCOUNTER — Ambulatory Visit (INDEPENDENT_AMBULATORY_CARE_PROVIDER_SITE_OTHER): Payer: BLUE CROSS/BLUE SHIELD | Admitting: Family Medicine

## 2017-05-29 DIAGNOSIS — M726 Necrotizing fasciitis: Secondary | ICD-10-CM

## 2017-05-29 NOTE — Assessment & Plan Note (Signed)
Resolved now and continued slow healing. Should be ready to resume normal activities in 2 wks.

## 2017-05-29 NOTE — Progress Notes (Signed)
   Subjective:    Patient ID: Beth Green is a 51 y.o. female presenting with Follow-up  on 05/29/2017  HPI: S/p WLE of VIN3. She then developed necrotizing fasciitis. In hospital x 1 wk. On Abx. S/p debridement x 2. Now with wound vac in place. Seen today for f/u. Her wound nurse is stating it is probably time to dc the NPWT. She is not having fever, chills.   Review of Systems  Constitutional: Negative for chills and fever.  Respiratory: Negative for shortness of breath.   Cardiovascular: Negative for chest pain.  Gastrointestinal: Negative for abdominal pain, nausea and vomiting.  Genitourinary: Negative for dysuria.  Skin: Negative for rash.      Objective:    BP 130/84   Pulse 87   Ht 5\' 8"  (1.727 m)   Wt 180 lb 9.6 oz (81.9 kg)   BMI 27.46 kg/m  Physical Exam  Constitutional: She is oriented to person, place, and time. She appears well-developed and well-nourished. No distress.  HENT:  Head: Normocephalic and atraumatic.  Eyes: No scleral icterus.  Neck: Neck supple.  Cardiovascular: Normal rate.   Pulmonary/Chest: Effort normal.  Abdominal: Soft.  Genitourinary:  Genitourinary Comments: Alger Simons is clean. Foam and tegaderm removed. Small defect noted but dry. Labia with continued healing and no necrotic or purulent areas noted  Neurological: She is alert and oriented to person, place, and time.  Skin: Skin is warm and dry.  Psychiatric: She has a normal mood and affect.        Assessment & Plan:   Problem List Items Addressed This Visit      Unprioritized   Necrotizing fasciitis ([place]) vulva    Resolved now and continued slow healing. Should be ready to resume normal activities in 2 wks.         Total face-to-face time with patient: 15 minutes. Over 50% of encounter was spent on counseling and coordination of care. Return in about 2 weeks (around 06/12/2017).  Donnamae Jude 05/29/2017 11:53 AM

## 2017-06-05 ENCOUNTER — Ambulatory Visit (INDEPENDENT_AMBULATORY_CARE_PROVIDER_SITE_OTHER): Payer: BLUE CROSS/BLUE SHIELD | Admitting: Gastroenterology

## 2017-06-05 ENCOUNTER — Encounter: Payer: Self-pay | Admitting: Gastroenterology

## 2017-06-05 VITALS — BP 124/86 | HR 72 | Ht 68.0 in | Wt 179.8 lb

## 2017-06-05 DIAGNOSIS — Z1212 Encounter for screening for malignant neoplasm of rectum: Secondary | ICD-10-CM | POA: Diagnosis not present

## 2017-06-05 DIAGNOSIS — K219 Gastro-esophageal reflux disease without esophagitis: Secondary | ICD-10-CM

## 2017-06-05 DIAGNOSIS — Z1211 Encounter for screening for malignant neoplasm of colon: Secondary | ICD-10-CM | POA: Diagnosis not present

## 2017-06-05 DIAGNOSIS — K580 Irritable bowel syndrome with diarrhea: Secondary | ICD-10-CM

## 2017-06-05 MED ORDER — NA SULFATE-K SULFATE-MG SULF 17.5-3.13-1.6 GM/177ML PO SOLN: 1.0000 | Freq: Once | ORAL | 0 refills | Status: AC

## 2017-06-05 NOTE — Patient Instructions (Signed)
You have been scheduled for a colonoscopy. Please follow written instructions given to you at your visit today.  Please pick up your prep supplies at the pharmacy within the next 1-3 days. If you use inhalers (even only as needed), please bring them with you on the day of your procedure. Your physician has requested that you go to www.startemmi.com and enter the access code given to you at your visit today. This web site gives a general overview about your procedure. However, you should still follow specific instructions given to you by our office regarding your preparation for the procedure.  Thank you for choosing me and [facility].  Malcolm T. Stark, Jr., MD., FACG  

## 2017-06-05 NOTE — Progress Notes (Signed)
    History of Present Illness: This is a 51 year old female with GERD and IBS.  Her symptoms are under excellent control on pantoprazole and dicyclomine.  She has no GI complaints.  We discussed colorectal cancer screening.  Current Medications, Allergies, Past Medical History, Past Surgical History, Family History and Social History were reviewed in [employer] record.  Physical Exam: General: Well developed, well nourished, no acute distress Head: Normocephalic and atraumatic Eyes:  sclerae anicteric, EOMI Ears: Normal auditory acuity Mouth: No deformity or lesions Lungs: Clear throughout to auscultation Heart: Regular rate and rhythm; no murmurs, rubs or bruits Abdomen: Soft, non tender and non distended. No masses, hepatosplenomegaly or hernias noted. Normal Bowel sounds Rectal: deferred to colonoscopy Musculoskeletal: Symmetrical with no gross deformities  Pulses:  Normal pulses noted Extremities: No clubbing, cyanosis, edema or deformities noted Neurological: Alert oriented x 4, grossly nonfocal Psychological:  Alert and cooperative. Normal mood and affect  Assessment and Recommendations:  1. IBS-D.  Continue to avoid foods that exacerbate symptoms.  Continue dicyclomine 10 mg 3 times daily before meals as needed. REV in 1 year.   2. GERD.  Follow standard antireflux measures and continue pantoprazole 40 mg daily. REV in 1 year.   3. CRC screening, average risk.  Schedule colonoscopy.  Patient requests to proceed after the holidays in January. The risks (including bleeding, perforation, infection, missed lesions, medication reactions and possible hospitalization or surgery if complications occur), benefits, and alternatives to colonoscopy with possible biopsy and possible polypectomy were discussed with the patient and they consent to proceed.

## 2017-06-12 ENCOUNTER — Encounter: Payer: Self-pay | Admitting: Family Medicine

## 2017-06-12 ENCOUNTER — Ambulatory Visit: Payer: BLUE CROSS/BLUE SHIELD | Admitting: Obstetrics and Gynecology

## 2017-06-12 ENCOUNTER — Ambulatory Visit (INDEPENDENT_AMBULATORY_CARE_PROVIDER_SITE_OTHER): Payer: BLUE CROSS/BLUE SHIELD | Admitting: Family Medicine

## 2017-06-12 DIAGNOSIS — L732 Hidradenitis suppurativa: Secondary | ICD-10-CM

## 2017-06-12 DIAGNOSIS — M726 Necrotizing fasciitis: Secondary | ICD-10-CM

## 2017-06-12 NOTE — Assessment & Plan Note (Signed)
Use Dial soap and Hibiclens to ensure no further infection.

## 2017-06-12 NOTE — Assessment & Plan Note (Addendum)
Completely resolved. Released to return to work and full activity. Suspect nerve regeneration is cause of tingling in vulva.

## 2017-06-12 NOTE — Progress Notes (Signed)
   Subjective:    Patient ID: Beth Green is a 51 y.o. female presenting with Follow-up (necrotizing fasciitis)  on 06/12/2017  HPI: Here today for f/u. She is s/p large debridement for necrotizing fasciitis and wound vac. Wound is now closed on perineum. She is here for final check. Still with hidradenitis. Has some numbness and pins and needles feelings at times.  Review of Systems  Constitutional: Negative for chills and fever.  Respiratory: Negative for shortness of breath.   Cardiovascular: Negative for chest pain.  Gastrointestinal: Negative for abdominal pain, nausea and vomiting.  Genitourinary: Negative for dysuria.  Skin: Negative for rash.      Objective:    BP 125/84   Pulse 82   Wt 181 lb 3.2 oz (82.2 kg)   BMI 27.55 kg/m  Physical Exam  Constitutional: She is oriented to person, place, and time. She appears well-developed and well-nourished. No distress.  HENT:  Head: Normocephalic and atraumatic.  Eyes: No scleral icterus.  Neck: Neck supple.  Cardiovascular: Normal rate.  Pulmonary/Chest: Effort normal.  Abdominal: Soft.  Genitourinary:  Genitourinary Comments: Perineal wound is closed. Right labia is well healed and approximated. There is a small amount of granulation tissues noted and this is treated with AgNO3.  Neurological: She is alert and oriented to person, place, and time.  Skin: Skin is warm and dry.  Psychiatric: She has a normal mood and affect.        Assessment & Plan:   Problem List Items Addressed This Visit      Unprioritized   Hidradenitis suppurativa    Use Dial soap and Hibiclens to ensure no further infection.      Necrotizing fasciitis ([place]) vulva    Completely resolved. Released to return to work and full activity. Suspect nerve regeneration is cause of tingling in vulva.         Total face-to-face time with patient: 15 minutes. Over 50% of encounter was spent on counseling and coordination of care. No Follow-up on  file.  Donnamae Jude 06/12/2017 2:36 PM

## 2017-08-08 ENCOUNTER — Encounter: Payer: Self-pay | Admitting: Gastroenterology

## 2017-08-09 MED ORDER — PEG 3350-KCL-NABCB-NACL-NASULF 236 G PO SOLR: 4000.0000 mL | Freq: Once | ORAL | 0 refills | Status: AC

## 2017-08-09 NOTE — Telephone Encounter (Signed)
Sent new instructions via my chart and sent new rx to patient's pharmacy for Golytely.

## 2017-08-12 ENCOUNTER — Ambulatory Visit (AMBULATORY_SURGERY_CENTER): Payer: BLUE CROSS/BLUE SHIELD | Admitting: Gastroenterology

## 2017-08-12 ENCOUNTER — Other Ambulatory Visit: Payer: Self-pay

## 2017-08-12 ENCOUNTER — Encounter: Payer: Self-pay | Admitting: Gastroenterology

## 2017-08-12 VITALS — BP 101/58 | HR 74 | Temp 97.7°F | Resp 11 | Ht 67.0 in | Wt 160.0 lb

## 2017-08-12 DIAGNOSIS — Z86010 Personal history of colonic polyps: Secondary | ICD-10-CM | POA: Diagnosis not present

## 2017-08-12 DIAGNOSIS — D124 Benign neoplasm of descending colon: Secondary | ICD-10-CM

## 2017-08-12 DIAGNOSIS — K6389 Other specified diseases of intestine: Secondary | ICD-10-CM | POA: Diagnosis not present

## 2017-08-12 DIAGNOSIS — D125 Benign neoplasm of sigmoid colon: Secondary | ICD-10-CM

## 2017-08-12 DIAGNOSIS — D126 Benign neoplasm of colon, unspecified: Secondary | ICD-10-CM

## 2017-08-12 DIAGNOSIS — D122 Benign neoplasm of ascending colon: Secondary | ICD-10-CM

## 2017-08-12 MED ORDER — SODIUM CHLORIDE 0.9 % IV SOLN: 500.0000 mL | INTRAVENOUS | Status: DC

## 2017-08-12 NOTE — Progress Notes (Signed)
Report to PACU, RN, vss, BBS= Clear.  

## 2017-08-12 NOTE — Progress Notes (Signed)
Called to room to assist during endoscopic procedure.  Patient ID and intended procedure confirmed with present staff. Received instructions for my participation in the procedure from the performing physician.  

## 2017-08-12 NOTE — Op Note (Signed)
[facility] Patient Name: Beth Green Procedure Date: 08/12/2017 8:53 AM MRN: [id number] Endoscopist: Ladene Artist , MD Age: 52 Referring MD:  Date of Birth: 02-16-1966 Gender: Female Account #: [id number] Procedure:                Colonoscopy Indications:              Screening for colorectal malignant neoplasm Medicines:                Monitored Anesthesia Care Procedure:                Pre-Anesthesia Assessment:                           - Prior to the procedure, a History and Physical                            was performed, and patient medications and                            allergies were reviewed. The patient's tolerance of                            previous anesthesia was also reviewed. The risks                            and benefits of the procedure and the sedation                            options and risks were discussed with the patient.                            All questions were answered, and informed consent                            was obtained. Prior Anticoagulants: The patient has                            taken no previous anticoagulant or antiplatelet                            agents. ASA Grade Assessment: II - A patient with                            mild systemic disease. After reviewing the risks                            and benefits, the patient was deemed in                            satisfactory condition to undergo the procedure.                           After obtaining informed consent, the colonoscope  was passed under direct vision. Throughout the                            procedure, the patient's blood pressure, pulse, and                            oxygen saturations were monitored continuously. The                            Model PCF-H190DL [id number]) scope was introduced                            through the anus and advanced to the the cecum,                            identified by  appendiceal orifice and ileocecal                            valve. The ileocecal valve, appendiceal orifice,                            and rectum were photographed. The quality of the                            bowel preparation was excellent. The colonoscopy                            was performed without difficulty. The patient                            tolerated the procedure well. Scope In: 9:01:29 AM Scope Out: 9:16:11 AM Scope Withdrawal Time: 0 hours 11 minutes 21 seconds  Total Procedure Duration: 0 hours 14 minutes 42 seconds  Findings:                 The perianal and digital rectal examinations were                            normal.                           Three sessile polyps were found in the sigmoid                            colon, descending colon and ascending colon. The                            polyps were 5 to 8 mm in size. These polyps were                            removed with a cold snare. Resection and retrieval                            were complete.  Internal hemorrhoids were found during                            retroflexion. The hemorrhoids were small and Grade                            I (internal hemorrhoids that do not prolapse).                           The exam was otherwise without abnormality on                            direct and retroflexion views. Complications:            No immediate complications. Estimated blood loss:                            None. Estimated Blood Loss:     Estimated blood loss: none. Impression:               - Three 5 to 8 mm polyps in the sigmoid colon, in                            the descending colon and in the ascending colon,                            removed with a cold snare. Resected and retrieved.                           - Internal hemorrhoids.                           - The examination was otherwise normal on direct                            and retroflexion  views. Recommendation:           - Repeat colonoscopy in 3 - 5 years for                            surveillance pending pathology review.                           - Patient has a contact number available for                            emergencies. The signs and symptoms of potential                            delayed complications were discussed with the                            patient. Return to normal activities tomorrow.                            Written discharge instructions  were provided to the                            patient.                           - Resume previous diet.                           - Continue present medications.                           - Await pathology results. Ladene Artist, MD 08/12/2017 9:19:07 AM This report has been signed electronically.

## 2017-08-12 NOTE — Patient Instructions (Signed)
YOU HAD AN ENDOSCOPIC PROCEDURE TODAY AT THE [facility]:   Refer to the procedure report that was given to you for any specific questions about what was found during the examination.  If the procedure report does not answer your questions, please call your gastroenterologist to clarify.  If you requested that your care partner not be given the details of your procedure findings, then the procedure report has been included in a sealed envelope for you to review at your convenience later.  YOU SHOULD EXPECT: Some feelings of bloating in the abdomen. Passage of more gas than usual.  Walking can help get rid of the air that was put into your GI tract during the procedure and reduce the bloating. If you had a lower endoscopy (such as a colonoscopy or flexible sigmoidoscopy) you may notice spotting of blood in your stool or on the toilet paper. If you underwent a bowel prep for your procedure, you may not have a normal bowel movement for a few days.  Please Note:  You might notice some irritation and congestion in your nose or some drainage.  This is from the oxygen used during your procedure.  There is no need for concern and it should clear up in a day or so.  SYMPTOMS TO REPORT IMMEDIATELY:   Following lower endoscopy (colonoscopy or flexible sigmoidoscopy):  Excessive amounts of blood in the stool  Significant tenderness or worsening of abdominal pains  Swelling of the abdomen that is new, acute  Fever of 100F or higher  For urgent or emergent issues, a gastroenterologist can be reached at any hour by calling [phone].   DIET:  We do recommend a small meal at first, but then you may proceed to your regular diet.  Drink plenty of fluids but you should avoid alcoholic beverages for 24 hours.  ACTIVITY:  You should plan to take it easy for the rest of today and you should NOT DRIVE or use heavy machinery until tomorrow (because of the sedation medicines used during the test).     FOLLOW UP: Our staff will call the number listed on your records the next business day following your procedure to check on you and address any questions or concerns that you may have regarding the information given to you following your procedure. If we do not reach you, we will leave a message.  However, if you are feeling well and you are not experiencing any problems, there is no need to return our call.  We will assume that you have returned to your regular daily activities without incident.  If any biopsies were taken you will be contacted by phone or by letter within the next 1-3 weeks.  Please call us at [phone] if you have not heard about the biopsies in 3 weeks.    SIGNATURES/CONFIDENTIALITY: You and/or your care partner have signed paperwork which will be entered into your electronic medical record.  These signatures attest to the fact that that the information above on your After Visit Summary has been reviewed and is understood.  Full responsibility of the confidentiality of this discharge information lies with you and/or your care-partner. 

## 2017-08-13 ENCOUNTER — Telehealth: Payer: Self-pay

## 2017-08-13 NOTE — Telephone Encounter (Signed)
  Follow up Call-  Call back number 08/12/2017  Post procedure Call Back phone  # [phone]  Permission to leave phone message Yes     Patient questions:  Do you have a fever, pain , or abdominal swelling? No. Pain Score  0 *  Have you tolerated food without any problems? Yes.    Have you been able to return to your normal activities? Yes.    Do you have any questions about your discharge instructions: Diet   No. Medications  No. Follow up visit  No.  Do you have questions or concerns about your Care? No.  Actions: * If pain score is 4 or above: No action needed, pain <4.

## 2017-08-22 ENCOUNTER — Encounter: Payer: Self-pay | Admitting: Gastroenterology

## 2017-09-29 ENCOUNTER — Other Ambulatory Visit: Payer: Self-pay | Admitting: Internal Medicine

## 2017-11-07 ENCOUNTER — Other Ambulatory Visit: Payer: Self-pay

## 2017-11-07 DIAGNOSIS — F411 Generalized anxiety disorder: Secondary | ICD-10-CM

## 2017-11-07 MED ORDER — BUPROPION HCL 75 MG PO TABS: ORAL_TABLET | ORAL | 2 refills | Status: DC

## 2017-11-07 NOTE — Telephone Encounter (Signed)
Refill on Bupropion 75 mg.

## 2017-12-17 ENCOUNTER — Encounter: Payer: Self-pay | Admitting: Radiology

## 2017-12-25 ENCOUNTER — Other Ambulatory Visit: Payer: Self-pay | Admitting: Internal Medicine

## 2018-01-17 ENCOUNTER — Other Ambulatory Visit: Payer: Self-pay | Admitting: Internal Medicine

## 2018-01-17 MED ORDER — LISINOPRIL-HYDROCHLOROTHIAZIDE 10-12.5 MG PO TABS
1.0000 | ORAL_TABLET | Freq: Every day | ORAL | 0 refills | Status: DC
Start: 2018-01-17 — End: 2018-03-16

## 2018-01-17 NOTE — Telephone Encounter (Signed)
CPE needed letter mailed

## 2018-01-28 ENCOUNTER — Other Ambulatory Visit: Payer: Self-pay | Admitting: Family Medicine

## 2018-01-28 DIAGNOSIS — F411 Generalized anxiety disorder: Secondary | ICD-10-CM

## 2018-03-03 NOTE — Progress Notes (Signed)
MM- 03/11/2017

## 2018-03-04 ENCOUNTER — Ambulatory Visit (INDEPENDENT_AMBULATORY_CARE_PROVIDER_SITE_OTHER): Payer: BLUE CROSS/BLUE SHIELD | Admitting: Family Medicine

## 2018-03-04 ENCOUNTER — Encounter: Payer: Self-pay | Admitting: Family Medicine

## 2018-03-04 VITALS — BP 136/86 | HR 72 | Wt 183.2 lb

## 2018-03-04 DIAGNOSIS — L732 Hidradenitis suppurativa: Secondary | ICD-10-CM

## 2018-03-04 DIAGNOSIS — Z01419 Encounter for gynecological examination (general) (routine) without abnormal findings: Secondary | ICD-10-CM | POA: Diagnosis not present

## 2018-03-04 DIAGNOSIS — D071 Carcinoma in situ of vulva: Secondary | ICD-10-CM

## 2018-03-04 NOTE — Assessment & Plan Note (Signed)
To discuss with dermatology, any new treatment options.

## 2018-03-04 NOTE — Progress Notes (Signed)
  Subjective:     Beth Green is a 52 y.o. female and is here for a comprehensive physical exam. The patient reports problems - recurrent hidradenitis. BP is well controlled on BP meds..   The following portions of the patient's history were reviewed and updated as appropriate: allergies, current medications, past family history, past medical history, past social history, past surgical history and problem list.  Review of Systems Pertinent items noted in HPI and remainder of comprehensive ROS otherwise negative.   Objective:    BP 136/86   Pulse 72   Wt 183 lb 3.2 oz (83.1 kg)   BMI 28.69 kg/m  General appearance: alert, cooperative and appears stated age Head: Normocephalic, without obvious abnormality, atraumatic Neck: no adenopathy, supple, symmetrical, trachea midline and thyroid not enlarged, symmetric, no tenderness/mass/nodules Lungs: clear to auscultation bilaterally Breasts: normal appearance, no masses or tenderness Heart: regular rate and rhythm, S1, S2 normal, no murmur, click, rub or gallop Abdomen: soft, non-tender; bowel sounds normal; no masses,  no organomegaly Pelvic: external genitalia normal, no adnexal masses or tenderness, uterus surgically absent and vaginal atrophy, no lesions, well healed scars noted Extremities: extremities normal, atraumatic, no cyanosis or edema Pulses: 2+ and symmetric Skin: Skin color, texture, turgor normal. No rashes or lesions Lymph nodes: Cervical, supraclavicular, and axillary nodes normal. Neurologic: Grossly normal    Assessment:    Healthy female exam.      Plan:  Mammogram scheduled for next week    Problem List Items Addressed This Visit      Unprioritized   Hidradenitis suppurativa    To discuss with dermatology, any new treatment options.      Vulvar intraepithelial neoplasia (VIN) grade 3    S/p removal--no recurrence noted       Other Visit Diagnoses    Encounter for gynecological examination without  abnormal finding    -  Primary    Pap smear is not indicated due to hysterectomy.  See After Visit Summary for Counseling Recommendations

## 2018-03-04 NOTE — Assessment & Plan Note (Signed)
S/p removal--no recurrence noted

## 2018-03-04 NOTE — Patient Instructions (Signed)
Preventive Care 40-64 Years, Female Preventive care refers to lifestyle choices and visits with your health care provider that can promote health and wellness. What does preventive care include?  A yearly physical exam. This is also called an annual well check.  Dental exams once or twice a year.  Routine eye exams. Ask your health care provider how often you should have your eyes checked.  Personal lifestyle choices, including: ? Daily care of your teeth and gums. ? Regular physical activity. ? Eating a healthy diet. ? Avoiding tobacco and drug use. ? Limiting alcohol use. ? Practicing safe sex. ? Taking low-dose aspirin daily starting at age 58. ? Taking vitamin and mineral supplements as recommended by your health care provider. What happens during an annual well check? The services and screenings done by your health care provider during your annual well check will depend on your age, overall health, lifestyle risk factors, and family history of disease. Counseling Your health care provider may ask you questions about your:  Alcohol use.  Tobacco use.  Drug use.  Emotional well-being.  Home and relationship well-being.  Sexual activity.  Eating habits.  Work and work Statistician.  Method of birth control.  Menstrual cycle.  Pregnancy history.  Screening You may have the following tests or measurements:  Height, weight, and BMI.  Blood pressure.  Lipid and cholesterol levels. These may be checked every 5 years, or more frequently if you are over 81 years old.  Skin check.  Lung cancer screening. You may have this screening every year starting at age 78 if you have a 30-pack-year history of smoking and currently smoke or have quit within the past 15 years.  Fecal occult blood test (FOBT) of the stool. You may have this test every year starting at age 65.  Flexible sigmoidoscopy or colonoscopy. You may have a sigmoidoscopy every 5 years or a colonoscopy  every 10 years starting at age 30.  Hepatitis C blood test.  Hepatitis B blood test.  Sexually transmitted disease (STD) testing.  Diabetes screening. This is done by checking your blood sugar (glucose) after you have not eaten for a while (fasting). You may have this done every 1-3 years.  Mammogram. This may be done every 1-2 years. Talk to your health care provider about when you should start having regular mammograms. This may depend on whether you have a family history of breast cancer.  BRCA-related cancer screening. This may be done if you have a family history of breast, ovarian, tubal, or peritoneal cancers.  Pelvic exam and Pap test. This may be done every 3 years starting at age 80. Starting at age 36, this may be done every 5 years if you have a Pap test in combination with an HPV test.  Bone density scan. This is done to screen for osteoporosis. You may have this scan if you are at high risk for osteoporosis.  Discuss your test results, treatment options, and if necessary, the need for more tests with your health care provider. Vaccines Your health care provider may recommend certain vaccines, such as:  Influenza vaccine. This is recommended every year.  Tetanus, diphtheria, and acellular pertussis (Tdap, Td) vaccine. You may need a Td booster every 10 years.  Varicella vaccine. You may need this if you have not been vaccinated.  Zoster vaccine. You may need this after age 5.  Measles, mumps, and rubella (MMR) vaccine. You may need at least one dose of MMR if you were born in  1957 or later. You may also need a second dose.  Pneumococcal 13-valent conjugate (PCV13) vaccine. You may need this if you have certain conditions and were not previously vaccinated.  Pneumococcal polysaccharide (PPSV23) vaccine. You may need one or two doses if you smoke cigarettes or if you have certain conditions.  Meningococcal vaccine. You may need this if you have certain  conditions.  Hepatitis A vaccine. You may need this if you have certain conditions or if you travel or work in places where you may be exposed to hepatitis A.  Hepatitis B vaccine. You may need this if you have certain conditions or if you travel or work in places where you may be exposed to hepatitis B.  Haemophilus influenzae type b (Hib) vaccine. You may need this if you have certain conditions.  Talk to your health care provider about which screenings and vaccines you need and how often you need them. This information is not intended to replace advice given to you by your health care provider. Make sure you discuss any questions you have with your health care provider. Document Released: 08/19/2015 Document Revised: 04/11/2016 Document Reviewed: 05/24/2015 Elsevier Interactive Patient Education  2018 [employer]  

## 2018-03-06 DIAGNOSIS — C4491 Basal cell carcinoma of skin, unspecified: Secondary | ICD-10-CM

## 2018-03-06 HISTORY — DX: Basal cell carcinoma of skin, unspecified: C44.91

## 2018-03-10 ENCOUNTER — Encounter: Payer: Self-pay | Admitting: Radiology

## 2018-03-16 ENCOUNTER — Other Ambulatory Visit: Payer: Self-pay | Admitting: Internal Medicine

## 2018-03-18 MED ORDER — LISINOPRIL-HYDROCHLOROTHIAZIDE 10-12.5 MG PO TABS
1.0000 | ORAL_TABLET | Freq: Every day | ORAL | 0 refills | Status: DC
Start: 2018-03-18 — End: 2018-04-22

## 2018-04-01 ENCOUNTER — Other Ambulatory Visit: Payer: Self-pay | Admitting: Family Medicine

## 2018-04-01 DIAGNOSIS — F411 Generalized anxiety disorder: Secondary | ICD-10-CM

## 2018-04-22 ENCOUNTER — Ambulatory Visit (INDEPENDENT_AMBULATORY_CARE_PROVIDER_SITE_OTHER): Payer: BLUE CROSS/BLUE SHIELD | Admitting: Internal Medicine

## 2018-04-22 ENCOUNTER — Encounter: Payer: Self-pay | Admitting: Internal Medicine

## 2018-04-22 VITALS — BP 124/82 | HR 77 | Temp 98.1°F | Ht 67.5 in | Wt 185.0 lb

## 2018-04-22 DIAGNOSIS — Z0000 Encounter for general adult medical examination without abnormal findings: Secondary | ICD-10-CM

## 2018-04-22 DIAGNOSIS — D071 Carcinoma in situ of vulva: Secondary | ICD-10-CM | POA: Diagnosis not present

## 2018-04-22 DIAGNOSIS — K219 Gastro-esophageal reflux disease without esophagitis: Secondary | ICD-10-CM

## 2018-04-22 DIAGNOSIS — L732 Hidradenitis suppurativa: Secondary | ICD-10-CM

## 2018-04-22 DIAGNOSIS — E782 Mixed hyperlipidemia: Secondary | ICD-10-CM

## 2018-04-22 DIAGNOSIS — I10 Essential (primary) hypertension: Secondary | ICD-10-CM

## 2018-04-22 DIAGNOSIS — F411 Generalized anxiety disorder: Secondary | ICD-10-CM

## 2018-04-22 DIAGNOSIS — E559 Vitamin D deficiency, unspecified: Secondary | ICD-10-CM

## 2018-04-22 LAB — CBC
HCT: 42.6 % (ref 36.0–46.0)
Hemoglobin: 14.6 g/dL (ref 12.0–15.0)
MCHC: 34.2 g/dL (ref 30.0–36.0)
MCV: 88.1 fl (ref 78.0–100.0)
Platelets: 163 10*3/uL (ref 150.0–400.0)
RBC: 4.83 Mil/uL (ref 3.87–5.11)
RDW: 13.4 % (ref 11.5–15.5)
WBC: 7.5 10*3/uL (ref 4.0–10.5)

## 2018-04-22 LAB — COMPREHENSIVE METABOLIC PANEL
ALT: 40 U/L — ABNORMAL HIGH (ref 0–35)
AST: 26 U/L (ref 0–37)
Albumin: 4.6 g/dL (ref 3.5–5.2)
Alkaline Phosphatase: 68 U/L (ref 39–117)
BUN: 12 mg/dL (ref 6–23)
CO2: 27 mEq/L (ref 19–32)
Calcium: 9.7 mg/dL (ref 8.4–10.5)
Chloride: 102 mEq/L (ref 96–112)
Creatinine, Ser: 0.64 mg/dL (ref 0.40–1.20)
GFR: 103.35 mL/min (ref >60.00)
Glucose, Bld: 102 mg/dL — ABNORMAL HIGH (ref 70–99)
Potassium: 4.3 mEq/L (ref 3.5–5.1)
Sodium: 140 mEq/L (ref 135–145)
Total Bilirubin: 0.5 mg/dL (ref 0.2–1.2)
Total Protein: 7.1 g/dL (ref 6.0–8.3)

## 2018-04-22 LAB — LDL CHOLESTEROL, DIRECT: Direct LDL: 142 mg/dL

## 2018-04-22 LAB — VITAMIN D 25 HYDROXY (VIT D DEFICIENCY, FRACTURES): VITD: 14.22 ng/mL — ABNORMAL LOW (ref 30.00–100.00)

## 2018-04-22 LAB — LIPID PANEL
Cholesterol: 243 mg/dL — ABNORMAL HIGH (ref 0–200)
HDL: 46.8 mg/dL (ref >39.00)
NonHDL: 196.17
Total CHOL/HDL Ratio: 5
Triglycerides: 337 mg/dL — ABNORMAL HIGH (ref 0.0–149.0)
VLDL: 67.4 mg/dL — ABNORMAL HIGH (ref 0.0–40.0)

## 2018-04-22 MED ORDER — LISINOPRIL-HYDROCHLOROTHIAZIDE 10-12.5 MG PO TABS: 1.0000 | ORAL_TABLET | Freq: Every day | ORAL | 3 refills | Status: DC

## 2018-04-22 MED ORDER — PANTOPRAZOLE SODIUM 40 MG PO TBEC: 40.0000 mg | DELAYED_RELEASE_TABLET | Freq: Every day | ORAL | 3 refills | Status: DC

## 2018-04-22 NOTE — Assessment & Plan Note (Signed)
Stable on current dose of Pantoprazole, refilled today CBC and CMET

## 2018-04-22 NOTE — Patient Instructions (Signed)

## 2018-04-22 NOTE — Progress Notes (Signed)
Subjective:    Patient ID: Beth Green, female    DOB: 16-Feb-1966, 52 y.o.   MRN: [id number]  HPI  Pt presents to the clinic today for her annual exam. She is also due to follow up chronic conditions.  GERD: She is not sure what her triggers are. She takes Pantoprazole as prescribed with good relief. She follows with Dr. Fuller Plan. There is no upper GI on file.  Hidradenitis: She bathes with Chlorhexadine as needed. She is currently taking Doxycycline. She follows with dermatology.  HTN: Her BP today is 124/82. She is taking Lisinopril-HCTZ as prescribed. There is no ECG on file.   VIN 3: s/p excision on 9/13 by Dr. Kennon Rounds. She is following with GYN yearly.  Anxiety: Stable on Wellbutrin. She denies depression, SI/HI.   Flu: never Tetanus: 08/2016 Pap Smear: partial hysterectomy Mammogram: 03/2018 Colon Screening:08/2017, 5 years Vision Screening: as needed Dentist: as needed  Diet: She does eat meat. She consumes fruits and veggies daily. She occassionally eats some fried foods. She drinks mostly water. Exercise: None   Review of Systems  Past Medical History:  Diagnosis Date  . Anemia   . Anxiety   . GAD (generalized anxiety disorder)   . GERD (gastroesophageal reflux disease)   . Head cold   . History of hidradenitis suppurativa   . History of uterine fibroid   . Hypertension   . Vulvar intraepithelial neoplasia (VIN) grade 3     Current Outpatient Medications  Medication Sig Dispense Refill  . Ascorbic Acid (VITAMIN C) 1000 MG tablet Take 1,000 mg by mouth daily.    Marland Kitchen buPROPion (WELLBUTRIN) 75 MG tablet TAKE 1/2 TAB BY MOUTH EVERY DAY FOR 5 DAYS, THEN 1/2 TAB TWICE DAILY FOR 5 DAYS, THEN 1 TAB 2X/DAY 60 tablet 2  . dicyclomine (BENTYL) 10 MG capsule Take 1 capsule (10 mg total) by mouth 3 (three) times daily before meals. 90 capsule 11  . diphenhydrAMINE (BENADRYL) 50 MG capsule Take 50 mg by mouth as needed for sleep.    Marland Kitchen lisinopril-hydrochlorothiazide  (PRINZIDE,ZESTORETIC) 10-12.5 MG tablet Take 1 tablet by mouth daily. 90 tablet 0  . pantoprazole (PROTONIX) 40 MG tablet Take 1 tablet (40 mg total) by mouth daily with breakfast. 30 tablet 11   No current facility-administered medications for this visit.     No Known Allergies  Family History  Problem Relation Age of Onset  . Hypertension Paternal Grandfather   . Diabetes Father   . Colon cancer Neg Hx   . Stomach cancer Neg Hx   . Esophageal cancer Neg Hx     Social History   Socioeconomic History  . Marital status: Married    Spouse name: Not on file  . Number of children: Not on file  . Years of education: Not on file  . Highest education level: Not on file  Occupational History  . Not on file  Social Needs  . Financial resource strain: Not on file  . Food insecurity:    Worry: Not on file    Inability: Not on file  . Transportation needs:    Medical: Not on file    Non-medical: Not on file  Tobacco Use  . Smoking status: Current Every Day Smoker    Packs/day: 0.25    Years: 25.00    Pack years: 6.25    Types: Cigarettes  . Smokeless tobacco: Never Used  Substance and Sexual Activity  . Alcohol use: Yes    Alcohol/week: 10.0 standard  drinks    Types: 10 Glasses of wine per week    Comment: occasional  . Drug use: No  . Sexual activity: Yes    Partners: Male    Birth control/protection: Surgical    Comment: Hysterectomy  Lifestyle  . Physical activity:    Days per week: Not on file    Minutes per session: Not on file  . Stress: Not on file  Relationships  . Social connections:    Talks on phone: Not on file    Gets together: Not on file    Attends religious service: Not on file    Active member of club or organization: Not on file    Attends meetings of clubs or organizations: Not on file    Relationship status: Not on file  . Intimate partner violence:    Fear of current or ex partner: Not on file    Emotionally abused: Not on file     Physically abused: Not on file    Forced sexual activity: Not on file  Other Topics Concern  . Not on file  Social History Narrative  . Not on file     Constitutional: Denies fever, malaise, fatigue, headache or abrupt weight changes.  HEENT: Denies eye pain, eye redness, ear pain, ringing in the ears, wax buildup, runny nose, nasal congestion, bloody nose, or sore throat. Respiratory: Denies difficulty breathing, shortness of breath, cough or sputum production.   Cardiovascular: Denies chest pain, chest tightness, palpitations or swelling in the hands or feet.  Gastrointestinal: Pt reports intermittent loose stools. Denies abdominal pain, bloating, constipation, or blood in the stool.  GU: Denies urgency, frequency, pain with urination, burning sensation, blood in urine, odor or discharge. Musculoskeletal: Denies decrease in range of motion, difficulty with gait, muscle pain or joint pain and swelling.  Skin: Denies redness, rashes, lesions or ulcercations.  Neurological: Denies dizziness, difficulty with memory, difficulty with speech or problems with balance and coordination.  Psych: Pt has a history of anxiety. Denies depression, SI/HI.  No other specific complaints in a complete review of systems (except as listed in HPI above).     Objective:   Physical Exam   BP 124/82   Pulse 77   Temp 98.1 F (36.7 C) (Oral)   Ht 5' 7.5" (1.715 m)   Wt 185 lb (83.9 kg)   SpO2 98%   BMI 28.55 kg/m  Wt Readings from Last 3 Encounters:  04/22/18 185 lb (83.9 kg)  03/04/18 183 lb 3.2 oz (83.1 kg)  08/12/17 160 lb (72.6 kg)    General: Appears her stated age, well developed, well nourished in NAD. Skin: Warm, dry and intact.  HEENT: Head: normal shape and size; Eyes: sclera white, no icterus, conjunctiva pink, PERRLA and EOMs intact; Ears: Tm's gray and intact, normal light reflex; Throat/Mouth: Teeth present, mucosa pink and moist, no exudate, lesions or ulcerations noted.  Neck:   Neck supple, trachea midline. No masses, lumps or thyromegaly present.  Cardiovascular: Normal rate and rhythm. S1,S2 noted.  No murmur, rubs or gallops noted. No JVD or BLE edema. No carotid bruits noted. Pulmonary/Chest: Normal effort and positive vesicular breath sounds. No respiratory distress. No wheezes, rales or ronchi noted.  Abdomen: Soft and nontender. Normal bowel sounds. No distention or masses noted. Liver, spleen and kidneys non palpable. Musculoskeletal: Strength 5/5 BUE/BLE. No difficulty with gait.  Neurological: Alert and oriented. Cranial nerves II-XII grossly intact. Coordination normal.  Psychiatric: Mood and affect normal. Behavior is normal.  Judgment and thought content normal.    BMET    Component Value Date/Time   NA 140 04/30/2017 0614   NA 144 02/15/2017 1158   K 3.1 (L) 04/30/2017 0614   CL 107 04/30/2017 0614   CO2 29 04/30/2017 0614   GLUCOSE 106 (H) 04/30/2017 0614   BUN 15 04/30/2017 0614   BUN 10 02/15/2017 1158   CREATININE 0.58 05/05/2017 0521   CALCIUM 8.1 (L) 04/30/2017 0614   GFRNONAA >60 05/05/2017 0521   GFRAA >60 05/05/2017 0521    Lipid Panel     Component Value Date/Time   CHOL 258 (H) 02/15/2017 1158   TRIG 320 (H) 02/15/2017 1158   HDL 53 02/15/2017 1158   CHOLHDL 4.9 (H) 02/15/2017 1158   LDLCALC 141 (H) 02/15/2017 1158    CBC    Component Value Date/Time   WBC 9.8 05/01/2017 0523   RBC 3.25 (L) 05/01/2017 0523   HGB 10.2 (L) 05/01/2017 0523   HGB 14.8 02/15/2017 1158   HCT 30.8 (L) 05/01/2017 0523   HCT 44.0 02/15/2017 1158   PLT 184 05/01/2017 0523   PLT 153 02/15/2017 1158   MCV 94.8 05/01/2017 0523   MCV 92 02/15/2017 1158   MCH 31.4 05/01/2017 0523   MCHC 33.1 05/01/2017 0523   RDW 13.2 05/01/2017 0523   RDW 13.2 02/15/2017 1158   LYMPHSABS 1.5 05/01/2017 0523   MONOABS 0.2 05/01/2017 0523   EOSABS 0.0 05/01/2017 0523   BASOSABS 0.0 05/01/2017 0523    Hgb A1C No results found for: HGBA1C           Assessment & Plan:   Preventative Health Maintenance:  She declines flu shots Tetanus UTD No longer needs pap smears but follows with GYN yearly Mammogram UTD Colon Screening UTD Encouraged her to consume a balanced diet and exercise regimen Advised her to see an eye doctor and dentist annually Will check CBC, CMET, Lipid and Vit D today  RTC in 1 year, sooner if needed [name], NP

## 2018-04-22 NOTE — Assessment & Plan Note (Addendum)
Following with derm Currently on abx Continue Chlorhexidine prn

## 2018-04-22 NOTE — Assessment & Plan Note (Signed)
Stable on Wellbutrin (prescribed by GYN) Support offered

## 2018-04-22 NOTE — Assessment & Plan Note (Signed)
Following with GYN

## 2018-04-22 NOTE — Assessment & Plan Note (Addendum)
Controlled on Lisinopril- HCTZ, refilled today CMET today Reinforced DASH diet and aerobic exercise

## 2018-05-09 MED ORDER — VITAMIN D (ERGOCALCIFEROL) 1.25 MG (50000 UNIT) PO CAPS: 50000.0000 [IU] | ORAL_CAPSULE | ORAL | 0 refills | Status: DC

## 2018-05-09 MED ORDER — SIMVASTATIN 10 MG PO TABS: 10.0000 mg | ORAL_TABLET | Freq: Every day | ORAL | 3 refills | Status: DC

## 2018-05-09 NOTE — Addendum Note (Signed)
Addended by: Lurlean Nanny on: 05/09/2018 10:38 AM   Modules accepted: Orders

## 2018-05-19 ENCOUNTER — Other Ambulatory Visit: Payer: Self-pay | Admitting: Family Medicine

## 2018-05-19 DIAGNOSIS — F411 Generalized anxiety disorder: Secondary | ICD-10-CM

## 2018-05-28 IMAGING — CT CT ABD-PELV W/ CM
1 of 3 series · 13 of 32 positions shown, 18 images · IV contrast (iopamidol)
Comparison: None.

CLINICAL DATA: History of surgery 11 days ago 4 partial right
vulvectomy. Severe right vulvar swelling.

EXAM:
CT ABDOMEN AND PELVIS WITH CONTRAST
TECHNIQUE: Multidetector CT imaging of the abdomen and pelvis was performed
using the standard protocol following bolus administration of
intravenous contrast.
CONTRAST:  100mL DIL4WJ-TOO IOPAMIDOL (DIL4WJ-TOO) INJECTION 61%

[Series 2: routine abdomen/pelvis with · axial · 0.79mm/px · z∈[+537,+1032]mm · 13 of 111 slices shown, 18 images]
[im 6/111  soft-tissue]
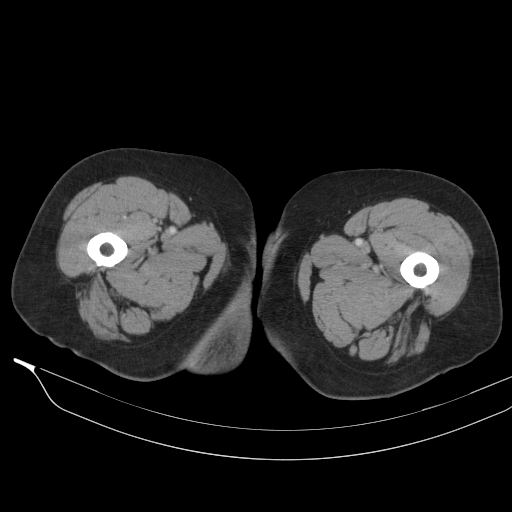
[im 6/111  bone]
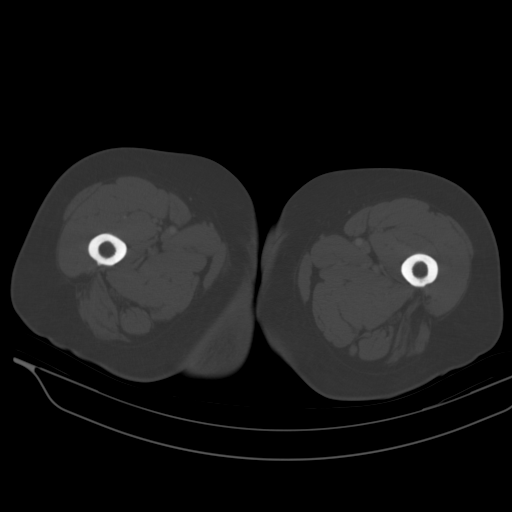
[im 17/111  soft-tissue]
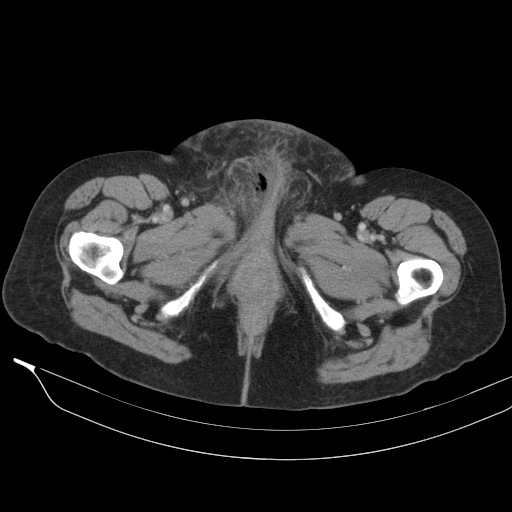
[im 23/111  soft-tissue]
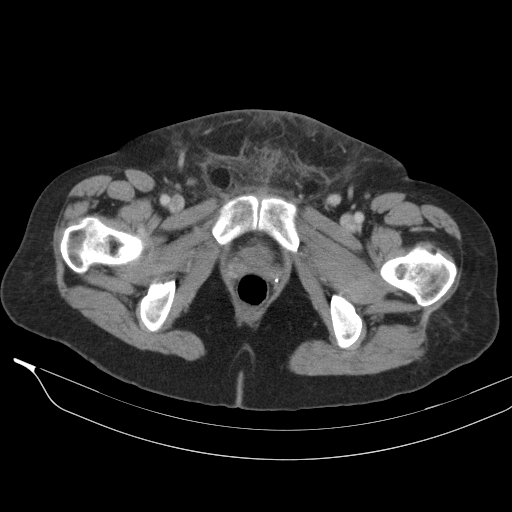
[im 34/111  soft-tissue]
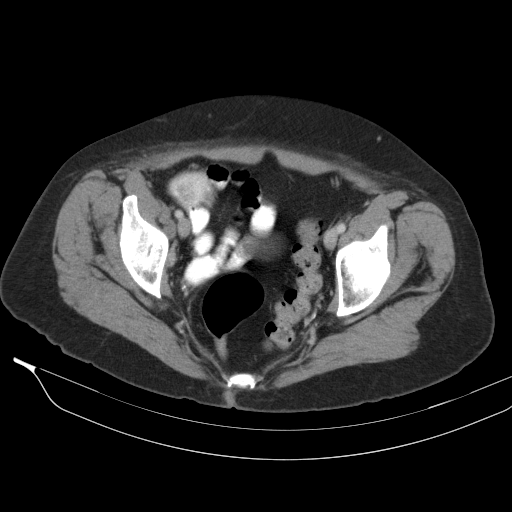
[im 45/111  soft-tissue]
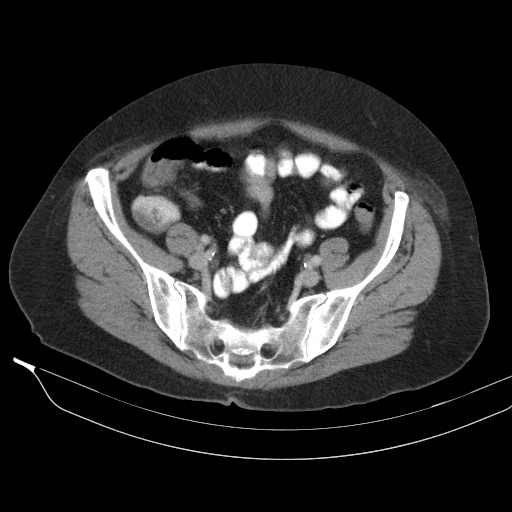
[im 50/111  soft-tissue]
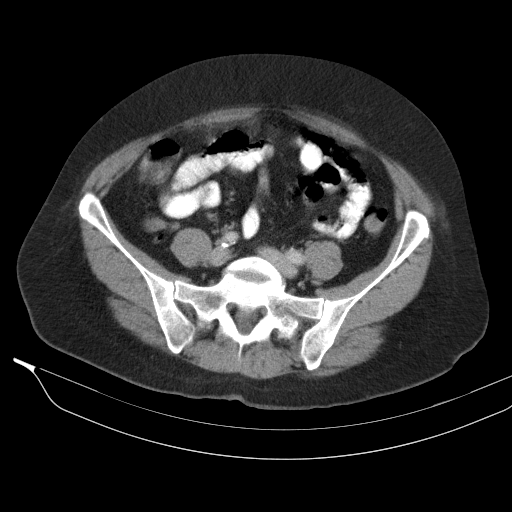
[im 61/111  soft-tissue]
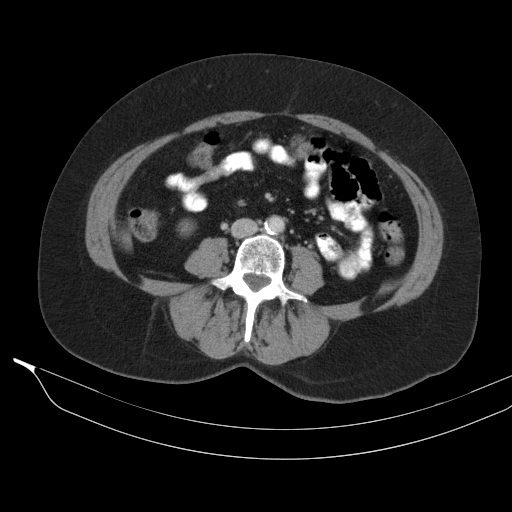
[im 67/111  soft-tissue]
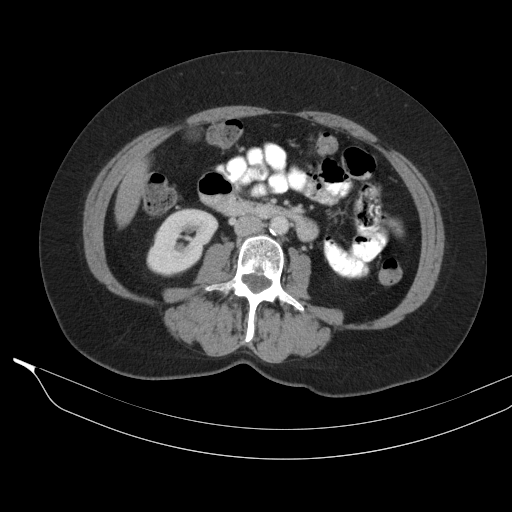
[im 78/111  soft-tissue]
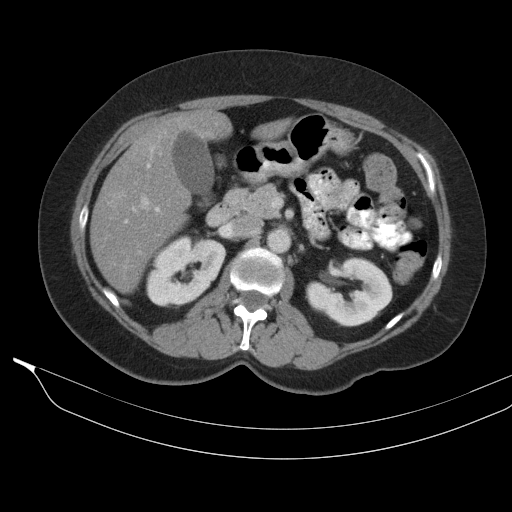
[im 78/111  bone]
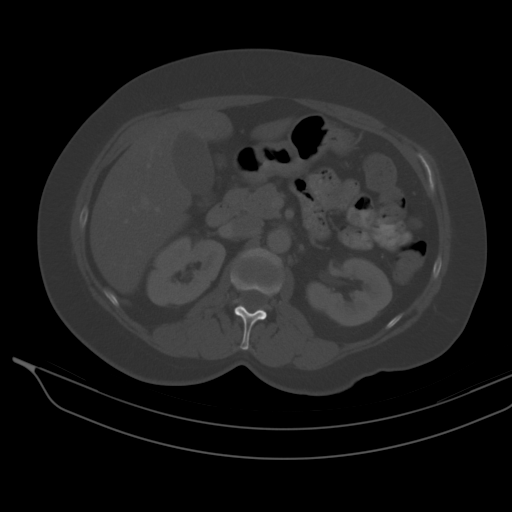
[im 89/111  soft-tissue]
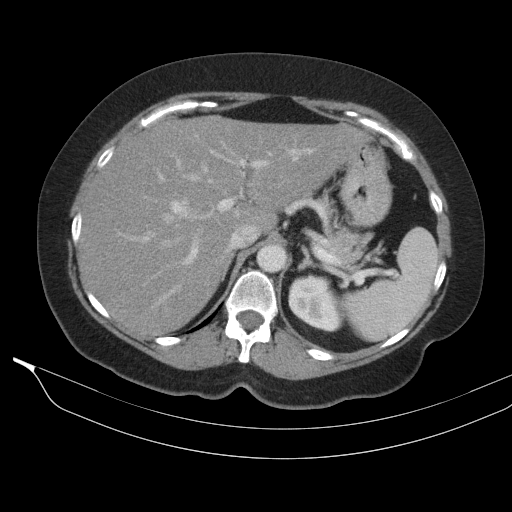
[im 89/111  lung]
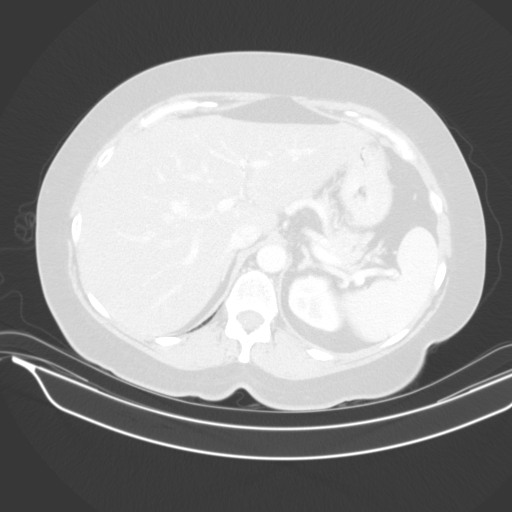
[im 94/111  soft-tissue]
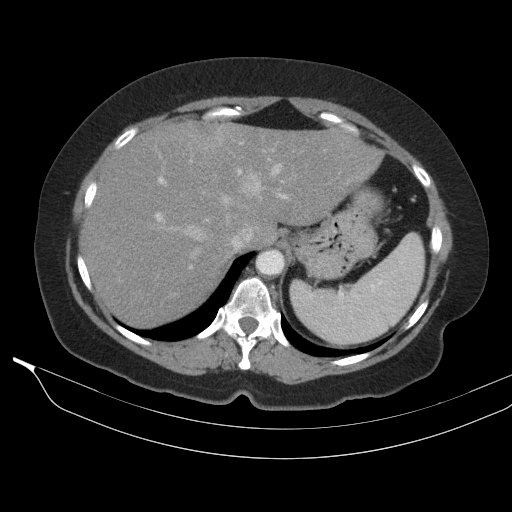
[im 94/111  lung]
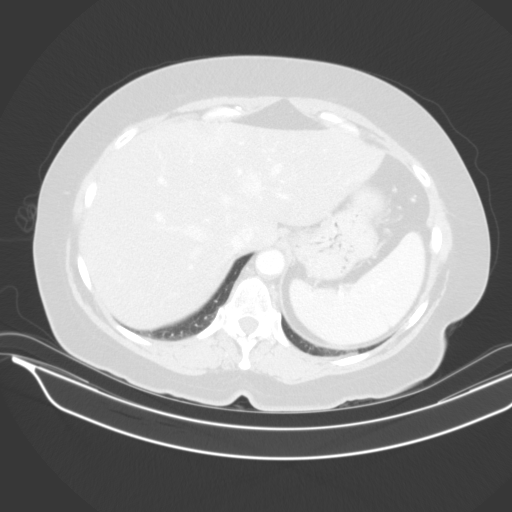
[im 100/111  lung]
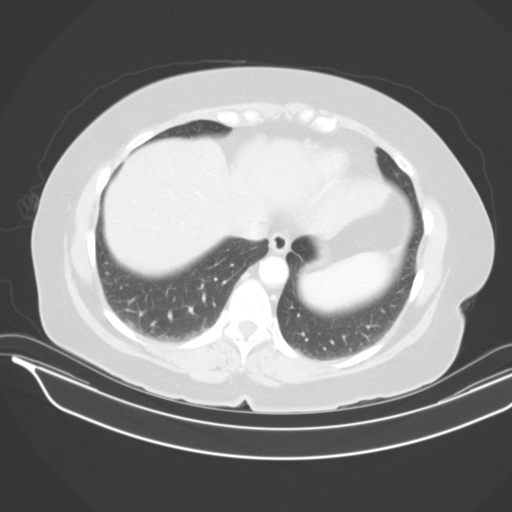
[im 105/111  soft-tissue]
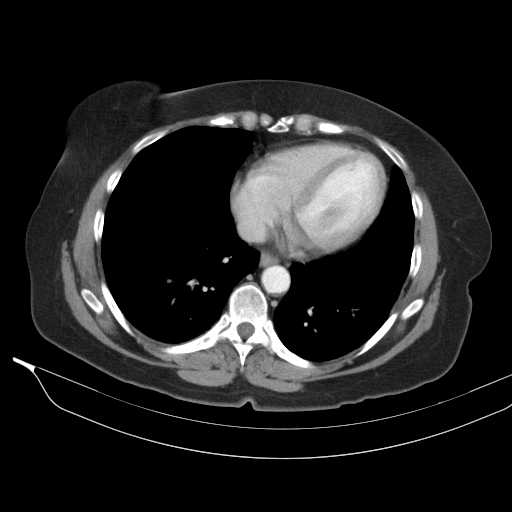
[im 105/111  lung]
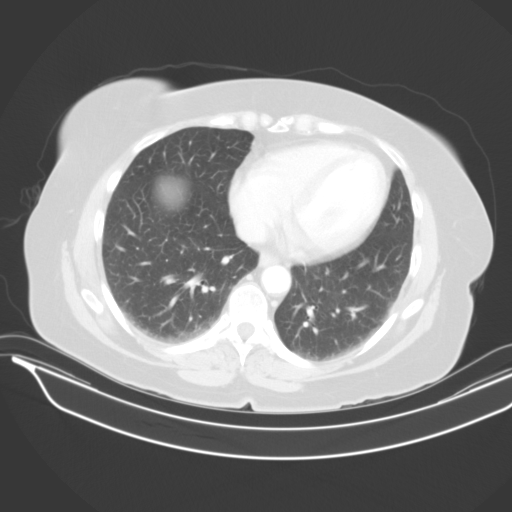

[13 of 32 positions shown; findings below may reference images not displayed]

FINDINGS: Lower chest: The lung bases are clear of acute process. No pleural
effusion or pulmonary lesions. The heart is normal in size. No
pericardial effusion. The distal esophagus and aorta are
unremarkable.

Hepatobiliary: Diffuse fatty infiltration of the liver but no focal
hepatic lesions or intrahepatic biliary dilatation. The gallbladder
is normal. No common bile duct dilatation.

Pancreas: No mass, inflammation or ductal dilatation.

Spleen: Normal size.  No focal lesions.

Adrenals/Urinary Tract: The adrenal glands and kidneys are
unremarkable. No ureteral or bladder calculi. No renal or bladder
mass.

Stomach/Bowel: The stomach, duodenum, small bowel and colon are
grossly normal. No inflammatory changes, mass lesions or obstructive
findings. The terminal ileum and appendix are normal.

Vascular/Lymphatic: The aorta is normal in caliber. No dissection.
Scattered atherosclerotic calcifications. The branch vessels are
patent. The major venous structures are patent. No mesenteric or
retroperitoneal mass or adenopathy. Small scattered lymph nodes are
noted.

Reproductive: Surgically absent.

Other: There is marked edema, inflammation and fluid in the
subcutaneous fat involving the suprapubic area, mons pubis, right
vulvar region and right perineum with a moderate amount of gas
noted. No discrete drainable abscess. Findings worrisome for
Fournier gangrene.

Musculoskeletal: No significant bony findings.
IMPRESSION: 1. Findings worrisome for Fournier gangrene involving the mons
pubis, suprapubic region, right vulvar area and perineum.
2. No discrete drainable postoperative abscess is identified.
3. No significant intrapelvic or intra-abdominal findings.
These results were called by telephone at the time of interpretation
on 04/28/2017 at [DATE] to Dr. Peaceful Ciya , who verbally
acknowledged these results.

## 2018-07-24 ENCOUNTER — Other Ambulatory Visit: Payer: Self-pay | Admitting: Internal Medicine

## 2018-07-24 DIAGNOSIS — E559 Vitamin D deficiency, unspecified: Secondary | ICD-10-CM

## 2018-08-04 ENCOUNTER — Other Ambulatory Visit: Payer: Self-pay | Admitting: Internal Medicine

## 2018-08-04 DIAGNOSIS — E785 Hyperlipidemia, unspecified: Secondary | ICD-10-CM

## 2018-08-11 ENCOUNTER — Other Ambulatory Visit (INDEPENDENT_AMBULATORY_CARE_PROVIDER_SITE_OTHER): Payer: BLUE CROSS/BLUE SHIELD

## 2018-08-11 DIAGNOSIS — E782 Mixed hyperlipidemia: Secondary | ICD-10-CM | POA: Diagnosis not present

## 2018-08-11 DIAGNOSIS — E559 Vitamin D deficiency, unspecified: Secondary | ICD-10-CM | POA: Diagnosis not present

## 2018-08-11 DIAGNOSIS — E785 Hyperlipidemia, unspecified: Secondary | ICD-10-CM

## 2018-08-11 LAB — COMPREHENSIVE METABOLIC PANEL
ALT: 55 U/L — ABNORMAL HIGH (ref 0–35)
AST: 33 U/L (ref 0–37)
Albumin: 4.3 g/dL (ref 3.5–5.2)
Alkaline Phosphatase: 65 U/L (ref 39–117)
BUN: 14 mg/dL (ref 6–23)
CO2: 30 mEq/L (ref 19–32)
Calcium: 9.9 mg/dL (ref 8.4–10.5)
Chloride: 103 mEq/L (ref 96–112)
Creatinine, Ser: 0.65 mg/dL (ref 0.40–1.20)
GFR: 101.4 mL/min (ref >60.00)
Glucose, Bld: 98 mg/dL (ref 70–99)
Potassium: 4.3 mEq/L (ref 3.5–5.1)
Sodium: 141 mEq/L (ref 135–145)
Total Bilirubin: 0.5 mg/dL (ref 0.2–1.2)
Total Protein: 6.6 g/dL (ref 6.0–8.3)

## 2018-08-11 LAB — LIPID PANEL
Cholesterol: 222 mg/dL — ABNORMAL HIGH (ref 0–200)
HDL: 49.4 mg/dL (ref >39.00)
NonHDL: 172.38
Total CHOL/HDL Ratio: 4
Triglycerides: 279 mg/dL — ABNORMAL HIGH (ref 0.0–149.0)
VLDL: 55.8 mg/dL — ABNORMAL HIGH (ref 0.0–40.0)

## 2018-08-11 LAB — VITAMIN D 25 HYDROXY (VIT D DEFICIENCY, FRACTURES): VITD: 38.28 ng/mL (ref 30.00–100.00)

## 2018-08-11 LAB — LDL CHOLESTEROL, DIRECT: Direct LDL: 128 mg/dL

## 2018-08-20 MED ORDER — SIMVASTATIN 20 MG PO TABS: 20.0000 mg | ORAL_TABLET | Freq: Every day | ORAL | 0 refills | Status: DC

## 2018-08-20 NOTE — Addendum Note (Signed)
Addended by: Lurlean Nanny on: 08/20/2018 11:11 AM   Modules accepted: Orders

## 2018-09-30 ENCOUNTER — Encounter: Payer: Self-pay | Admitting: Internal Medicine

## 2018-09-30 DIAGNOSIS — Z5181 Encounter for therapeutic drug level monitoring: Secondary | ICD-10-CM

## 2018-10-29 ENCOUNTER — Other Ambulatory Visit: Payer: Self-pay

## 2018-10-29 MED ORDER — LISINOPRIL 10 MG PO TABS: 10.0000 mg | ORAL_TABLET | Freq: Every day | ORAL | 0 refills | Status: DC

## 2018-10-29 MED ORDER — HYDROCHLOROTHIAZIDE 12.5 MG PO CAPS: 12.5000 mg | ORAL_CAPSULE | Freq: Every day | ORAL | 0 refills | Status: DC

## 2018-11-13 ENCOUNTER — Other Ambulatory Visit: Payer: Self-pay | Admitting: Internal Medicine

## 2018-11-13 NOTE — Telephone Encounter (Signed)
I need to schedule lab appt

## 2018-11-24 ENCOUNTER — Other Ambulatory Visit (INDEPENDENT_AMBULATORY_CARE_PROVIDER_SITE_OTHER): Payer: BLUE CROSS/BLUE SHIELD

## 2018-11-24 ENCOUNTER — Ambulatory Visit: Payer: BLUE CROSS/BLUE SHIELD | Admitting: Internal Medicine

## 2018-11-24 ENCOUNTER — Other Ambulatory Visit: Payer: Self-pay

## 2018-11-24 DIAGNOSIS — E782 Mixed hyperlipidemia: Secondary | ICD-10-CM

## 2018-11-24 DIAGNOSIS — Z5181 Encounter for therapeutic drug level monitoring: Secondary | ICD-10-CM | POA: Diagnosis not present

## 2018-11-24 LAB — LDL CHOLESTEROL, DIRECT: Direct LDL: 105 mg/dL

## 2018-11-24 LAB — COMPREHENSIVE METABOLIC PANEL
ALT: 34 U/L (ref 0–35)
AST: 21 U/L (ref 0–37)
Albumin: 4.5 g/dL (ref 3.5–5.2)
Alkaline Phosphatase: 60 U/L (ref 39–117)
BUN: 14 mg/dL (ref 6–23)
CO2: 30 mEq/L (ref 19–32)
Calcium: 9.5 mg/dL (ref 8.4–10.5)
Chloride: 102 mEq/L (ref 96–112)
Creatinine, Ser: 0.65 mg/dL (ref 0.40–1.20)
GFR: 95.3 mL/min (ref >60.00)
Glucose, Bld: 100 mg/dL — ABNORMAL HIGH (ref 70–99)
Potassium: 4 mEq/L (ref 3.5–5.1)
Sodium: 140 mEq/L (ref 135–145)
Total Bilirubin: 0.4 mg/dL (ref 0.2–1.2)
Total Protein: 6.8 g/dL (ref 6.0–8.3)

## 2018-11-24 LAB — LIPID PANEL
Cholesterol: 214 mg/dL — ABNORMAL HIGH (ref 0–200)
HDL: 47.6 mg/dL (ref >39.00)
NonHDL: 166.21
Total CHOL/HDL Ratio: 4
Triglycerides: 302 mg/dL — ABNORMAL HIGH (ref 0.0–149.0)
VLDL: 60.4 mg/dL — ABNORMAL HIGH (ref 0.0–40.0)

## 2019-01-23 ENCOUNTER — Other Ambulatory Visit: Payer: Self-pay | Admitting: Internal Medicine

## 2019-02-18 ENCOUNTER — Encounter: Payer: Self-pay | Admitting: Radiology

## 2019-03-16 ENCOUNTER — Telehealth: Payer: Self-pay | Admitting: Internal Medicine

## 2019-03-16 NOTE — Telephone Encounter (Signed)
Left message asking pt to call office regarding My chart message   Message  Friday the 18th doesn't work with my schedule. Is she there Monday mornings? I am available Monday the 14th.  If not , any other morning but Friday.   Thank you!   Beth Green  Appointment Request  Beth Green  Patient Appointment Schedule Request Pool 20 hours ago (2:37 PM)     Friday the 18th  doesn't work with my schedule.  Is she there Monday mornings? I am available  Monday the 14th. If not , any other morning but Friday.  Thank you!  Beth Green        [name], [name], [name] 3 days ago     Good morning, [name] is not here on Friday mornings- she works at [name]. I set you up for the next available Friday physical, 9/18 @ 2:30pm. She will need a full 4 hr fast for the labs- that means no food after 11am, water and black coffee are ok. Please let me know if this does not work for you. Thanks,        Beth Green  Patient Appointment Schedule Request Pool 4 days ago     Appointment Request From: Beth Green  With Provider: [name], NP [place] [facility]  Preferred Date Range: Any date 04/10/2019 or later  Preferred Times: Friday Morning  Reason for visit: Request an Appointment  Comments: Medication review Annual visit

## 2019-03-16 NOTE — Telephone Encounter (Signed)
Appointment 9/22

## 2019-04-07 ENCOUNTER — Other Ambulatory Visit: Payer: Self-pay | Admitting: Internal Medicine

## 2019-04-16 ENCOUNTER — Other Ambulatory Visit: Payer: Self-pay | Admitting: Internal Medicine

## 2019-04-24 ENCOUNTER — Encounter: Payer: BLUE CROSS/BLUE SHIELD | Admitting: Internal Medicine

## 2019-04-27 ENCOUNTER — Other Ambulatory Visit: Payer: Self-pay | Admitting: Family Medicine

## 2019-04-27 DIAGNOSIS — F411 Generalized anxiety disorder: Secondary | ICD-10-CM

## 2019-04-27 MED ORDER — BUPROPION HCL 75 MG PO TABS: 75.0000 mg | ORAL_TABLET | Freq: Two times a day (BID) | ORAL | 3 refills | Status: DC

## 2019-04-28 ENCOUNTER — Other Ambulatory Visit: Payer: Self-pay

## 2019-04-28 ENCOUNTER — Ambulatory Visit (INDEPENDENT_AMBULATORY_CARE_PROVIDER_SITE_OTHER): Payer: BC Managed Care – PPO | Admitting: Internal Medicine

## 2019-04-28 ENCOUNTER — Encounter: Payer: Self-pay | Admitting: Internal Medicine

## 2019-04-28 VITALS — BP 118/78 | HR 79 | Temp 98.3°F | Ht 67.5 in | Wt 187.0 lb

## 2019-04-28 DIAGNOSIS — L732 Hidradenitis suppurativa: Secondary | ICD-10-CM

## 2019-04-28 DIAGNOSIS — K219 Gastro-esophageal reflux disease without esophagitis: Secondary | ICD-10-CM | POA: Diagnosis not present

## 2019-04-28 DIAGNOSIS — Z0000 Encounter for general adult medical examination without abnormal findings: Secondary | ICD-10-CM

## 2019-04-28 DIAGNOSIS — I10 Essential (primary) hypertension: Secondary | ICD-10-CM | POA: Diagnosis not present

## 2019-04-28 DIAGNOSIS — E782 Mixed hyperlipidemia: Secondary | ICD-10-CM

## 2019-04-28 DIAGNOSIS — K582 Mixed irritable bowel syndrome: Secondary | ICD-10-CM

## 2019-04-28 DIAGNOSIS — R42 Dizziness and giddiness: Secondary | ICD-10-CM

## 2019-04-28 DIAGNOSIS — F411 Generalized anxiety disorder: Secondary | ICD-10-CM | POA: Diagnosis not present

## 2019-04-28 DIAGNOSIS — D071 Carcinoma in situ of vulva: Secondary | ICD-10-CM

## 2019-04-28 NOTE — Patient Instructions (Signed)
Health Maintenance, Female Adopting a healthy lifestyle and getting preventive care are important in promoting health and wellness. Ask your health care provider about:  The right schedule for you to have regular tests and exams.  Things you can do on your own to prevent diseases and keep yourself healthy. What should I know about diet, weight, and exercise? Eat a healthy diet   Eat a diet that includes plenty of vegetables, fruits, low-fat dairy products, and lean protein.  Do not eat a lot of foods that are high in solid fats, added sugars, or sodium. Maintain a healthy weight Body mass index (BMI) is used to identify weight problems. It estimates body fat based on height and weight. Your health care provider can help determine your BMI and help you achieve or maintain a healthy weight. Get regular exercise Get regular exercise. This is one of the most important things you can do for your health. Most adults should:  Exercise for at least 150 minutes each week. The exercise should increase your heart rate and make you sweat (moderate-intensity exercise).  Do strengthening exercises at least twice a week. This is in addition to the moderate-intensity exercise.  Spend less time sitting. Even light physical activity can be beneficial. Watch cholesterol and blood lipids Have your blood tested for lipids and cholesterol at 53 years of age, then have this test every 5 years. Have your cholesterol levels checked more often if:  Your lipid or cholesterol levels are high.  You are older than 53 years of age.  You are at high risk for heart disease. What should I know about cancer screening? Depending on your health history and family history, you may need to have cancer screening at various ages. This may include screening for:  Breast cancer.  Cervical cancer.  Colorectal cancer.  Skin cancer.  Lung cancer. What should I know about heart disease, diabetes, and high blood  pressure? Blood pressure and heart disease  High blood pressure causes heart disease and increases the risk of stroke. This is more likely to develop in people who have high blood pressure readings, are of African descent, or are overweight.  Have your blood pressure checked: ? Every 3-5 years if you are 18-39 years of age. ? Every year if you are 40 years old or older. Diabetes Have regular diabetes screenings. This checks your fasting blood sugar level. Have the screening done:  Once every three years after age 40 if you are at a normal weight and have a low risk for diabetes.  More often and at a younger age if you are overweight or have a high risk for diabetes. What should I know about preventing infection? Hepatitis B If you have a higher risk for hepatitis B, you should be screened for this virus. Talk with your health care provider to find out if you are at risk for hepatitis B infection. Hepatitis C Testing is recommended for:  Everyone born from 1945 through 1965.  Anyone with known risk factors for hepatitis C. Sexually transmitted infections (STIs)  Get screened for STIs, including gonorrhea and chlamydia, if: ? You are sexually active and are younger than 53 years of age. ? You are older than 53 years of age and your health care provider tells you that you are at risk for this type of infection. ? Your sexual activity has changed since you were last screened, and you are at increased risk for chlamydia or gonorrhea. Ask your health care provider if   you are at risk.  Ask your health care provider about whether you are at high risk for HIV. Your health care provider may recommend a prescription medicine to help prevent HIV infection. If you choose to take medicine to prevent HIV, you should first get tested for HIV. You should then be tested every 3 months for as long as you are taking the medicine. Pregnancy  If you are about to stop having your period (premenopausal) and  you may become pregnant, seek counseling before you get pregnant.  Take 400 to 800 micrograms (mcg) of folic acid every day if you become pregnant.  Ask for birth control (contraception) if you want to prevent pregnancy. Osteoporosis and menopause Osteoporosis is a disease in which the bones lose minerals and strength with aging. This can result in bone fractures. If you are 65 years old or older, or if you are at risk for osteoporosis and fractures, ask your health care provider if you should:  Be screened for bone loss.  Take a calcium or vitamin D supplement to lower your risk of fractures.  Be given hormone replacement therapy (HRT) to treat symptoms of menopause. Follow these instructions at home: Lifestyle  Do not use any products that contain nicotine or tobacco, such as cigarettes, e-cigarettes, and chewing tobacco. If you need help quitting, ask your health care provider.  Do not use street drugs.  Do not share needles.  Ask your health care provider for help if you need support or information about quitting drugs. Alcohol use  Do not drink alcohol if: ? Your health care provider tells you not to drink. ? You are pregnant, may be pregnant, or are planning to become pregnant.  If you drink alcohol: ? Limit how much you use to 0-1 drink a day. ? Limit intake if you are breastfeeding.  Be aware of how much alcohol is in your drink. In the [place] one drink equals one 12 oz bottle of beer (355 mL), one 5 oz glass of wine (148 mL), or one 1 oz glass of hard liquor (44 mL). General instructions  Schedule regular health, dental, and eye exams.  Stay current with your vaccines.  Tell your health care provider if: ? You often feel depressed. ? You have ever been abused or do not feel safe at home. Summary  Adopting a healthy lifestyle and getting preventive care are important in promoting health and wellness.  Follow your health care provider's instructions about healthy  diet, exercising, and getting tested or screened for diseases.  Follow your health care provider's instructions on monitoring your cholesterol and blood pressure. This information is not intended to replace advice given to you by your health care provider. Make sure you discuss any questions you have with your health care provider. Document Released: 02/05/2011 Document Revised: 07/16/2018 Document Reviewed: 07/16/2018 Elsevier Patient Education  2020 [employer]  

## 2019-04-28 NOTE — Progress Notes (Signed)
Subjective:    Patient ID: Beth Green, female    DOB: Jul 12, 1966, 53 y.o.   MRN: [id number]  HPI  Pt presents to the clinic today for her annual exam. She is also due to follow up chronic conditions.  GERD: She is not sure what triggers this. She denies breakthrough on Pantoprazole. She follows with Dr. Fuller Plan. There is no upper GI on file.  Hidradenitis: She bathes with Chlorhexidine as needed. She is currently on a low dose of Doxycycline daily for prevention. She follows with dermatology.  HTN: Her BP today is 118/80. She is taking Lisinopril HCT as prescribed. There is no ECG on file.   VIN: s/p excision. She did not need chemo or radiation. She follows with GYN yearly.  Anxiety: Chronic but worse lately. She is not sure what is causing her symptoms to be worse. She is under more stress due to the pandemic but she feels like she has been coping well. She is taking Wellbutrin as prescribed. She denies depression. She is not currently seeing a therapist. She denies SI/HI.  HLD: Her last LDL was 105, triglycerides 305, 11/2018. She reports she has been out of her Simvastatin x 4 months. She tries to consume a low fat diet.  IBS: Alternating constipation and diarrhea. She takes Dicyclomine as needed for cramping. She is not following with GI.  Flu: never Tetanus: 08/2016 Pap Smear: partial hysterectomy Mammogram: 03/2018 Colon Screening: 08/2017, 5 years Vision Screening: as needed Dentist: as needed  Diet: She does eat meat. She consumes fruits and veggies daily. She tries to avoid fried foods. She drinks mostly coffee, water. Exercise: None  Review of Systems      Past Medical History:  Diagnosis Date  . Anemia   . Anxiety   . GAD (generalized anxiety disorder)   . GERD (gastroesophageal reflux disease)   . Head cold   . History of hidradenitis suppurativa   . History of uterine fibroid   . Hypertension   . Vulvar intraepithelial neoplasia (VIN) grade 3     Current  Outpatient Medications  Medication Sig Dispense Refill  . Ascorbic Acid (VITAMIN C) 1000 MG tablet Take 1,000 mg by mouth daily.    Marland Kitchen buPROPion (WELLBUTRIN) 75 MG tablet Take 1 tablet (75 mg total) by mouth 2 (two) times daily. 180 tablet 3  . dicyclomine (BENTYL) 10 MG capsule Take 1 capsule (10 mg total) by mouth 3 (three) times daily before meals. 90 capsule 11  . diphenhydrAMINE (BENADRYL) 50 MG capsule Take 50 mg by mouth as needed for sleep.    Marland Kitchen doxycycline (PERIOSTAT) 20 MG tablet Take 20 mg by mouth 2 (two) times daily with a meal.  2  . hydrochlorothiazide (MICROZIDE) 12.5 MG capsule TAKE 1 CAPSULE (12.5 MG TOTAL) BY MOUTH DAILY. MUST SCHEDULE PHYSICAL 90 capsule 0  . lisinopril (ZESTRIL) 10 MG tablet TAKE 1 TABLET (10 MG TOTAL) BY MOUTH DAILY. MUST SCHEDULE PHYSICAL 90 tablet 0  . lisinopril-hydrochlorothiazide (PRINZIDE,ZESTORETIC) 10-12.5 MG tablet Take 1 tablet by mouth daily. 90 tablet 3  . pantoprazole (PROTONIX) 40 MG tablet TAKE 1 TABLET (40 MG TOTAL) BY MOUTH DAILY WITH BREAKFAST. 30 tablet 0  . simvastatin (ZOCOR) 20 MG tablet TAKE 1 TABLET BY MOUTH EVERYDAY AT BEDTIME 90 tablet 0  . triamcinolone cream (KENALOG) 0.1 % APPLY 1 APPLICATION ON THE SKIN TWICE DAILY AS NEEDED FOR ITCHING. AVOID FACE. GROIN, UNDERARMS  2  . Vitamin D, Ergocalciferol, (DRISDOL) 50000 units CAPS capsule Take  1 capsule (50,000 Units total) by mouth every 7 (seven) days. 12 capsule 0   No current facility-administered medications for this visit.     No Known Allergies  Family History  Problem Relation Age of Onset  . Hypertension Paternal Grandfather   . Diabetes Father   . Colon cancer Neg Hx   . Stomach cancer Neg Hx   . Esophageal cancer Neg Hx     Social History   Socioeconomic History  . Marital status: Married    Spouse name: Not on file  . Number of children: Not on file  . Years of education: Not on file  . Highest education level: Not on file  Occupational History  . Not on  file  Social Needs  . Financial resource strain: Not on file  . Food insecurity    Worry: Not on file    Inability: Not on file  . Transportation needs    Medical: Not on file    Non-medical: Not on file  Tobacco Use  . Smoking status: Current Every Day Smoker    Packs/day: 0.25    Years: 25.00    Pack years: 6.25    Types: Cigarettes  . Smokeless tobacco: Never Used  Substance and Sexual Activity  . Alcohol use: Yes    Alcohol/week: 10.0 standard drinks    Types: 10 Glasses of wine per week    Comment: occasional  . Drug use: No  . Sexual activity: Yes    Partners: Male    Birth control/protection: Surgical    Comment: Hysterectomy  Lifestyle  . Physical activity    Days per week: Not on file    Minutes per session: Not on file  . Stress: Not on file  Relationships  . Social Herbalist on phone: Not on file    Gets together: Not on file    Attends religious service: Not on file    Active member of club or organization: Not on file    Attends meetings of clubs or organizations: Not on file    Relationship status: Not on file  . Intimate partner violence    Fear of current or ex partner: Not on file    Emotionally abused: Not on file    Physically abused: Not on file    Forced sexual activity: Not on file  Other Topics Concern  . Not on file  Social History Narrative  . Not on file     Constitutional: Denies fever, malaise, fatigue, headache or abrupt weight changes.  HEENT: Denies eye pain, eye redness, ear pain, ringing in the ears, wax buildup, runny nose, nasal congestion, bloody nose, or sore throat. Respiratory: Denies difficulty breathing, shortness of breath, cough or sputum production.   Cardiovascular: Denies chest pain, chest tightness, palpitations or swelling in the hands or feet.  Gastrointestinal: Pt has a history of reflux, constipation and diarrhea. Denies abdominal pain, bloating, or blood in the stool.  GU: Denies urgency, frequency,  pain with urination, burning sensation, blood in urine, odor or discharge. Musculoskeletal: Denies decrease in range of motion, difficulty with gait, muscle pain or joint pain and swelling.  Skin: Denies redness, rashes, lesions or ulcercations.  Neurological: Pt reports intermittent lightheadedness. Denies difficulty with memory, difficulty with speech or problems with balance and coordination.  Psych: Pt has a history of anxiety and depression. Denies SI/HI.  No other specific complaints in a complete review of systems (except as listed in HPI above).  Objective:  Physical Exam   BP 118/78   Pulse 79   Temp 98.3 F (36.8 C) (Temporal)   Ht 5' 7.5" (1.715 m)   Wt 187 lb (84.8 kg)   SpO2 98%   BMI 28.86 kg/m  Wt Readings from Last 3 Encounters:  04/28/19 187 lb (84.8 kg)  04/22/18 185 lb (83.9 kg)  03/04/18 183 lb 3.2 oz (83.1 kg)    General: Appears her stated age, well developed, well nourished in NAD. Skin: Warm, dry and intact. No rashesnoted. HEENT: Head: normal shape and size; Eyes: sclera white, no icterus, conjunctiva pink, PERRLA and EOMs intact; Ears: Tm's gray and intact, normal light reflex;  Neck:  Neck supple, trachea midline. No masses, lumps or thyromegaly present.  Cardiovascular: Normal rate and rhythm. S1,S2 noted.  No murmur, rubs or gallops noted. No JVD or BLE edema. No carotid bruits noted. Pulmonary/Chest: Normal effort and positive vesicular breath sounds. No respiratory distress. No wheezes, rales or ronchi noted.  Abdomen: Soft and nontender. Normal bowel sounds. No distention or masses noted. Liver, spleen and kidneys non palpable. Musculoskeletal: Strength 5/5 BUE/BLE. No difficulty with gait.  Neurological: Alert and oriented. Cranial nerves II-XII grossly intact. Coordination normal.  Psychiatric: Mood and affect normal. Behavior is normal. Judgment and thought content normal.     BMET    Component Value Date/Time   NA 137 04/28/2019 1602    NA 144 02/15/2017 1158   K 4.2 04/28/2019 1602   CL 96 04/28/2019 1602   CO2 30 04/28/2019 1602   GLUCOSE 97 04/28/2019 1602   BUN 12 04/28/2019 1602   BUN 10 02/15/2017 1158   CREATININE 0.67 04/28/2019 1602   CALCIUM 10.4 04/28/2019 1602   GFRNONAA >60 05/05/2017 0521   GFRAA >60 05/05/2017 0521    Lipid Panel     Component Value Date/Time   CHOL 248 (H) 04/28/2019 1602   CHOL 258 (H) 02/15/2017 1158   TRIG 379.0 (H) 04/28/2019 1602   HDL 46.00 04/28/2019 1602   HDL 53 02/15/2017 1158   CHOLHDL 5 04/28/2019 1602   VLDL 75.8 (H) 04/28/2019 1602   LDLCALC 141 (H) 02/15/2017 1158    CBC    Component Value Date/Time   WBC 9.1 04/28/2019 1602   RBC 4.66 04/28/2019 1602   HGB 14.3 04/28/2019 1602   HGB 14.8 02/15/2017 1158   HCT 41.9 04/28/2019 1602   HCT 44.0 02/15/2017 1158   PLT 174.0 04/28/2019 1602   PLT 153 02/15/2017 1158   MCV 89.9 04/28/2019 1602   MCV 92 02/15/2017 1158   MCH 31.4 05/01/2017 0523   MCHC 34.1 04/28/2019 1602   RDW 13.6 04/28/2019 1602   RDW 13.2 02/15/2017 1158   LYMPHSABS 1.5 05/01/2017 0523   MONOABS 0.2 05/01/2017 0523   EOSABS 0.0 05/01/2017 0523   BASOSABS 0.0 05/01/2017 0523    Hgb A1C No results found for: HGBA1C         Assessment & Plan:   Preventative Health Maintenance:  She declines flu shot today Tetanus UTD She no longer needs pap smears but pelvic exams every 5 years She will call to schedule her mammogram Colon screening UTD Encouraged her to consume a balanced diet and exercise regimen Advised her to see an eye doctor and dentist annually Will check CBC, CMET, TSH, Lipid and Vit D today  Lightheadedness:  Encouraged adequate fluid intake Orthostatics negative ? Anxiety related  RTC in 1 year, sooner if needed [name], NP

## 2019-04-29 ENCOUNTER — Other Ambulatory Visit: Payer: Self-pay | Admitting: Internal Medicine

## 2019-04-29 DIAGNOSIS — K589 Irritable bowel syndrome without diarrhea: Secondary | ICD-10-CM | POA: Insufficient documentation

## 2019-04-29 DIAGNOSIS — E559 Vitamin D deficiency, unspecified: Secondary | ICD-10-CM

## 2019-04-29 DIAGNOSIS — K582 Mixed irritable bowel syndrome: Secondary | ICD-10-CM | POA: Insufficient documentation

## 2019-04-29 DIAGNOSIS — E785 Hyperlipidemia, unspecified: Secondary | ICD-10-CM | POA: Insufficient documentation

## 2019-04-29 DIAGNOSIS — E782 Mixed hyperlipidemia: Secondary | ICD-10-CM

## 2019-04-29 LAB — COMPREHENSIVE METABOLIC PANEL
ALT: 42 U/L — ABNORMAL HIGH (ref 0–35)
AST: 24 U/L (ref 0–37)
Albumin: 4.7 g/dL (ref 3.5–5.2)
Alkaline Phosphatase: 67 U/L (ref 39–117)
BUN: 12 mg/dL (ref 6–23)
CO2: 30 mEq/L (ref 19–32)
Calcium: 10.4 mg/dL (ref 8.4–10.5)
Chloride: 96 mEq/L (ref 96–112)
Creatinine, Ser: 0.67 mg/dL (ref 0.40–1.20)
GFR: 91.88 mL/min (ref >60.00)
Glucose, Bld: 97 mg/dL (ref 70–99)
Potassium: 4.2 mEq/L (ref 3.5–5.1)
Sodium: 137 mEq/L (ref 135–145)
Total Bilirubin: 0.5 mg/dL (ref 0.2–1.2)
Total Protein: 6.8 g/dL (ref 6.0–8.3)

## 2019-04-29 LAB — VITAMIN D 25 HYDROXY (VIT D DEFICIENCY, FRACTURES): VITD: 21.63 ng/mL — ABNORMAL LOW (ref 30.00–100.00)

## 2019-04-29 LAB — CBC
HCT: 41.9 % (ref 36.0–46.0)
Hemoglobin: 14.3 g/dL (ref 12.0–15.0)
MCHC: 34.1 g/dL (ref 30.0–36.0)
MCV: 89.9 fl (ref 78.0–100.0)
Platelets: 174 10*3/uL (ref 150.0–400.0)
RBC: 4.66 Mil/uL (ref 3.87–5.11)
RDW: 13.6 % (ref 11.5–15.5)
WBC: 9.1 10*3/uL (ref 4.0–10.5)

## 2019-04-29 LAB — LIPID PANEL
Cholesterol: 248 mg/dL — ABNORMAL HIGH (ref 0–200)
HDL: 46 mg/dL (ref >39.00)
NonHDL: 201.79
Total CHOL/HDL Ratio: 5
Triglycerides: 379 mg/dL — ABNORMAL HIGH (ref 0.0–149.0)
VLDL: 75.8 mg/dL — ABNORMAL HIGH (ref 0.0–40.0)

## 2019-04-29 LAB — TSH: TSH: 1.26 u[IU]/mL (ref 0.35–4.50)

## 2019-04-29 LAB — LDL CHOLESTEROL, DIRECT: Direct LDL: 148 mg/dL

## 2019-04-29 MED ORDER — SIMVASTATIN 20 MG PO TABS: ORAL_TABLET | ORAL | 3 refills | Status: DC

## 2019-04-29 MED ORDER — VITAMIN D (ERGOCALCIFEROL) 1.25 MG (50000 UNIT) PO CAPS: 50000.0000 [IU] | ORAL_CAPSULE | ORAL | 0 refills | Status: DC

## 2019-04-29 MED ORDER — FENOFIBRATE 54 MG PO TABS: 54.0000 mg | ORAL_TABLET | Freq: Every day | ORAL | 2 refills | Status: DC

## 2019-04-29 MED ORDER — BUPROPION HCL 75 MG PO TABS: ORAL_TABLET | ORAL | 3 refills | Status: DC

## 2019-04-29 NOTE — Assessment & Plan Note (Signed)
Continue Pantoprazole CBC and CMET today 

## 2019-04-29 NOTE — Assessment & Plan Note (Signed)
Continue daily Doxycycline She will continue to follow with dermatology

## 2019-04-29 NOTE — Assessment & Plan Note (Signed)
Deteriorated Will increase Wellbutrin to 150 mg in am, 75 mg in pm Support offered today

## 2019-04-29 NOTE — Assessment & Plan Note (Signed)
She will continue to follow with gynecology yearly

## 2019-04-29 NOTE — Assessment & Plan Note (Signed)
Continue Dicyclomine Will monitor

## 2019-04-29 NOTE — Assessment & Plan Note (Signed)
CMET and Lipid profile today Encouraged her to consume a low fat diet Will likely need to restart Simvastatin pending labs

## 2019-04-29 NOTE — Assessment & Plan Note (Signed)
Controlled on Lisinopril HCT Reinforced DASH diet and exercise for weight loss CMET today 

## 2019-05-02 ENCOUNTER — Other Ambulatory Visit: Payer: Self-pay | Admitting: Internal Medicine

## 2019-05-04 ENCOUNTER — Telehealth: Payer: Self-pay | Admitting: *Deleted

## 2019-05-04 NOTE — Telephone Encounter (Signed)
Beth Green pharmacist at [facility] left a voicemail stating that they received scripts for Simvastatin and Fenofibrate. Beth Green stated that they received an interaction warning with these two medications which show that patient is at high risk of having severe myopathy, acute renal failure, etc. Beth Green requested a call back letting her know that it is still okay to fill?

## 2019-05-04 NOTE — Telephone Encounter (Signed)
Aware, please fill

## 2019-05-07 NOTE — Telephone Encounter (Signed)
Called the pharmacy to give the okay to fill medication

## 2019-05-09 ENCOUNTER — Encounter: Payer: Self-pay | Admitting: Internal Medicine

## 2019-07-05 ENCOUNTER — Other Ambulatory Visit: Payer: Self-pay | Admitting: Internal Medicine

## 2019-07-10 ENCOUNTER — Telehealth: Payer: Self-pay

## 2019-07-10 NOTE — Telephone Encounter (Signed)
LVM w COVID screen, front door and back lab info 12.4.2020 TLJ

## 2019-07-11 ENCOUNTER — Encounter: Payer: Self-pay | Admitting: Internal Medicine

## 2019-07-14 ENCOUNTER — Other Ambulatory Visit (INDEPENDENT_AMBULATORY_CARE_PROVIDER_SITE_OTHER): Payer: BC Managed Care – PPO

## 2019-07-14 ENCOUNTER — Other Ambulatory Visit: Payer: Self-pay

## 2019-07-14 DIAGNOSIS — E559 Vitamin D deficiency, unspecified: Secondary | ICD-10-CM | POA: Diagnosis not present

## 2019-07-14 DIAGNOSIS — E782 Mixed hyperlipidemia: Secondary | ICD-10-CM

## 2019-07-14 LAB — COMPREHENSIVE METABOLIC PANEL
ALT: 49 U/L — ABNORMAL HIGH (ref 0–35)
AST: 28 U/L (ref 0–37)
Albumin: 4.6 g/dL (ref 3.5–5.2)
Alkaline Phosphatase: 61 U/L (ref 39–117)
BUN: 14 mg/dL (ref 6–23)
CO2: 30 mEq/L (ref 19–32)
Calcium: 10 mg/dL (ref 8.4–10.5)
Chloride: 103 mEq/L (ref 96–112)
Creatinine, Ser: 0.73 mg/dL (ref 0.40–1.20)
GFR: 83.15 mL/min (ref >60.00)
Glucose, Bld: 107 mg/dL — ABNORMAL HIGH (ref 70–99)
Potassium: 4.1 mEq/L (ref 3.5–5.1)
Sodium: 140 mEq/L (ref 135–145)
Total Bilirubin: 0.5 mg/dL (ref 0.2–1.2)
Total Protein: 6.9 g/dL (ref 6.0–8.3)

## 2019-07-14 LAB — LDL CHOLESTEROL, DIRECT: Direct LDL: 129 mg/dL

## 2019-07-14 LAB — LIPID PANEL
Cholesterol: 210 mg/dL — ABNORMAL HIGH (ref 0–200)
HDL: 50.5 mg/dL (ref >39.00)
NonHDL: 159.14
Total CHOL/HDL Ratio: 4
Triglycerides: 221 mg/dL — ABNORMAL HIGH (ref 0.0–149.0)
VLDL: 44.2 mg/dL — ABNORMAL HIGH (ref 0.0–40.0)

## 2019-07-14 LAB — VITAMIN D 25 HYDROXY (VIT D DEFICIENCY, FRACTURES): VITD: 52.47 ng/mL (ref 30.00–100.00)

## 2019-07-15 MED ORDER — FENOFIBRATE 67 MG PO CAPS: 67.0000 mg | ORAL_CAPSULE | Freq: Every day | ORAL | 2 refills | Status: DC

## 2019-07-15 NOTE — Addendum Note (Signed)
Addended by: Jearld Fenton on: 07/15/2019 12:22 PM   Modules accepted: Orders

## 2019-07-20 ENCOUNTER — Other Ambulatory Visit: Payer: Self-pay | Admitting: Internal Medicine

## 2019-07-21 ENCOUNTER — Other Ambulatory Visit: Payer: Self-pay | Admitting: Internal Medicine

## 2019-07-22 ENCOUNTER — Telehealth: Payer: Self-pay

## 2019-07-22 ENCOUNTER — Encounter: Payer: Self-pay | Admitting: Internal Medicine

## 2019-07-22 NOTE — Telephone Encounter (Signed)
PA done via telephone with [facility] and has been approved through 2023

## 2019-08-06 ENCOUNTER — Other Ambulatory Visit: Payer: BC Managed Care – PPO

## 2019-08-07 ENCOUNTER — Other Ambulatory Visit: Payer: Self-pay | Admitting: Internal Medicine

## 2019-10-07 NOTE — Telephone Encounter (Signed)
error 

## 2019-10-22 ENCOUNTER — Other Ambulatory Visit: Payer: Self-pay | Admitting: Internal Medicine

## 2019-10-28 ENCOUNTER — Ambulatory Visit (INDEPENDENT_AMBULATORY_CARE_PROVIDER_SITE_OTHER): Payer: BC Managed Care – PPO | Admitting: Dermatology

## 2019-10-28 ENCOUNTER — Encounter: Payer: Self-pay | Admitting: Dermatology

## 2019-10-28 ENCOUNTER — Other Ambulatory Visit: Payer: Self-pay | Admitting: Dermatology

## 2019-10-28 ENCOUNTER — Other Ambulatory Visit: Payer: Self-pay

## 2019-10-28 VITALS — BP 111/77

## 2019-10-28 DIAGNOSIS — L732 Hidradenitis suppurativa: Secondary | ICD-10-CM | POA: Diagnosis not present

## 2019-10-28 MED ORDER — SPIRONOLACTONE 25 MG PO TABS: ORAL_TABLET | ORAL | 1 refills | Status: DC

## 2019-10-28 MED ORDER — DOXYCYCLINE HYCLATE 20 MG PO TABS: ORAL_TABLET | ORAL | 1 refills | Status: DC

## 2019-10-28 NOTE — Progress Notes (Signed)
   Follow-Up Visit   Subjective  Beth Green is a 54 y.o. female who presents for the following: Follow-up (HS inguinal folds. Pt improved on doxycycline 20mg  BID, spironolactone 25mg  QD, Hibaclens and clindamycin as needed. Tolerating medications well. Still with some active areas.   The following portions of the chart were reviewed this encounter and updated as appropriate:     Review of Systems: No other skin or systemic complaints.  Objective  Well appearing patient in no apparent distress; mood and affect are within normal limits.  A focused examination was performed including axillae, abdomen, groin. Relevant physical exam findings are noted in the Assessment and Plan.  Objective  Inguinal folds: Few inflammatory papules, low abdomen, inguinal folds.  Axillae clear. Few scars inframammary, low abdomen, inguinal folds.   Assessment & Plan  Hidradenitis suppurativa Inguinal folds  Chronic. Improving but not at goal.   Continue HibaClens and clindamycin as needed. May get more improvement with daily use. Continue Doxycycline 20mg  1 PO BID with food. Consider discontinuing after another month if continuing to improve.  Continue spironolactone 25mg  1 PO QD.  Doxycycline counseling - take doxycycline with food and drink to prevent nausea but do not take with dairy. Do not lay down for 30 minutes after taking. Be cautious with sun exposure and use good sun protection while on this medication. Women should not become pregnant while taking this medication.    Will order potassium. Pt has order.   Spironolactone counseling - Spironolactone can cause increased urination and cause blood pressure to decrease. Please watch for signs of lightheadedness and be cautious when changing position. It can sometimes cause breast tenderness or an irregular period in premenopausal women. It can also increase potassium. The increase in potassium usually is not a concern unless you are taking other  medicines that also increase potassium, so please be sure your doctor knows all of the other medications you are taking.   doxycycline (PERIOSTAT) 20 MG tablet - Inguinal folds  spironolactone (ALDACTONE) 25 MG tablet - Inguinal folds  Return in about 1 month (around 11/28/2019) for HS.   Graciella Belton, RMA, am acting as scribe for Forest Gleason, MD .  Documentation: I have reviewed the above documentation for accuracy and completeness, and I agree with the above.  Forest Gleason, MD

## 2019-10-28 NOTE — Patient Instructions (Signed)
Doxycycline counseling - take doxycycline with food and drink to prevent nausea but do not take with dairy. Do not lay down for 30 minutes after taking. Be cautious with sun exposure and use good sun protection while on this medication. Women should not become pregnant while taking this medication.

## 2019-10-29 LAB — POTASSIUM: Potassium: 4.9 mmol/L (ref 3.5–5.2)

## 2019-11-02 ENCOUNTER — Other Ambulatory Visit: Payer: Self-pay | Admitting: Internal Medicine

## 2019-11-03 NOTE — Progress Notes (Signed)
Potassium is within normal limits but increased from previous. Recommend continuing spironolactone as prescribed and recheck potassium again in 2 more weeks to be sure it doesn't continue going up.   Mas, please notify patient and place order to recheck the potassium in 2 weeks. Please ask her to mark her calendar to be sure she remembers to get the blood draw done. Thank you

## 2019-11-04 ENCOUNTER — Other Ambulatory Visit: Payer: Self-pay

## 2019-11-04 DIAGNOSIS — L732 Hidradenitis suppurativa: Secondary | ICD-10-CM

## 2019-11-05 ENCOUNTER — Other Ambulatory Visit: Payer: Self-pay | Admitting: Dermatology

## 2019-11-08 ENCOUNTER — Other Ambulatory Visit: Payer: Self-pay | Admitting: Dermatology

## 2019-11-15 ENCOUNTER — Other Ambulatory Visit: Payer: Self-pay | Admitting: Dermatology

## 2019-11-17 ENCOUNTER — Encounter: Payer: Self-pay | Admitting: Dermatology

## 2019-12-03 ENCOUNTER — Ambulatory Visit: Payer: BC Managed Care – PPO | Admitting: Dermatology

## 2020-01-26 ENCOUNTER — Other Ambulatory Visit: Payer: Self-pay | Admitting: Internal Medicine

## 2020-03-27 ENCOUNTER — Other Ambulatory Visit: Payer: Self-pay | Admitting: Internal Medicine

## 2020-04-12 ENCOUNTER — Other Ambulatory Visit: Payer: Self-pay | Admitting: Internal Medicine

## 2020-04-13 ENCOUNTER — Other Ambulatory Visit: Payer: Self-pay | Admitting: Dermatology

## 2020-04-15 ENCOUNTER — Other Ambulatory Visit: Payer: Self-pay | Admitting: Family Medicine

## 2020-04-15 DIAGNOSIS — F411 Generalized anxiety disorder: Secondary | ICD-10-CM

## 2020-05-01 ENCOUNTER — Other Ambulatory Visit: Payer: Self-pay | Admitting: Internal Medicine

## 2020-05-16 ENCOUNTER — Other Ambulatory Visit: Payer: Self-pay | Admitting: Internal Medicine

## 2020-05-24 ENCOUNTER — Encounter: Payer: BC Managed Care – PPO | Admitting: Internal Medicine

## 2020-05-24 ENCOUNTER — Telehealth: Payer: Self-pay | Admitting: Internal Medicine

## 2020-05-24 NOTE — Telephone Encounter (Signed)
Called and left vm for the patient to call back and reschedule 05/30/20 appt.

## 2020-05-30 ENCOUNTER — Encounter: Payer: BC Managed Care – PPO | Admitting: Internal Medicine

## 2020-06-27 ENCOUNTER — Other Ambulatory Visit: Payer: Self-pay | Admitting: Internal Medicine

## 2020-06-29 ENCOUNTER — Ambulatory Visit: Payer: BC Managed Care – PPO | Admitting: Dermatology

## 2020-07-07 ENCOUNTER — Other Ambulatory Visit: Payer: Self-pay

## 2020-07-07 ENCOUNTER — Other Ambulatory Visit: Payer: Self-pay | Admitting: Internal Medicine

## 2020-07-07 ENCOUNTER — Ambulatory Visit (INDEPENDENT_AMBULATORY_CARE_PROVIDER_SITE_OTHER): Payer: BC Managed Care – PPO | Admitting: Dermatology

## 2020-07-07 DIAGNOSIS — L732 Hidradenitis suppurativa: Secondary | ICD-10-CM

## 2020-07-07 DIAGNOSIS — Z872 Personal history of diseases of the skin and subcutaneous tissue: Secondary | ICD-10-CM | POA: Diagnosis not present

## 2020-07-07 DIAGNOSIS — L578 Other skin changes due to chronic exposure to nonionizing radiation: Secondary | ICD-10-CM

## 2020-07-07 DIAGNOSIS — Z1283 Encounter for screening for malignant neoplasm of skin: Secondary | ICD-10-CM

## 2020-07-07 DIAGNOSIS — Z85828 Personal history of other malignant neoplasm of skin: Secondary | ICD-10-CM | POA: Diagnosis not present

## 2020-07-07 DIAGNOSIS — D1800 Hemangioma unspecified site: Secondary | ICD-10-CM

## 2020-07-07 DIAGNOSIS — L821 Other seborrheic keratosis: Secondary | ICD-10-CM

## 2020-07-07 DIAGNOSIS — L570 Actinic keratosis: Secondary | ICD-10-CM | POA: Diagnosis not present

## 2020-07-07 DIAGNOSIS — D229 Melanocytic nevi, unspecified: Secondary | ICD-10-CM

## 2020-07-07 DIAGNOSIS — L814 Other melanin hyperpigmentation: Secondary | ICD-10-CM

## 2020-07-07 MED ORDER — CLINDAMYCIN PHOSPHATE 1 % EX LOTN: TOPICAL_LOTION | Freq: Every day | CUTANEOUS | 5 refills | Status: AC

## 2020-07-07 MED ORDER — DOXYCYCLINE HYCLATE 20 MG PO TABS: 20.0000 mg | ORAL_TABLET | Freq: Two times a day (BID) | ORAL | 6 refills | Status: DC

## 2020-07-07 MED ORDER — SPIRONOLACTONE 25 MG PO TABS: 25.0000 mg | ORAL_TABLET | Freq: Every day | ORAL | 0 refills | Status: DC

## 2020-07-07 NOTE — Progress Notes (Signed)
Follow-Up Visit   Subjective  Beth Green is a 54 y.o. female who presents for the following: FBSE (Patient with history of BCC and AK. She does have a few scaly spots at leg that she scratches at. ).  Patient being treated for hidradenitis at the inguinal folds and is treating with HibaClens, clindamycin, doxycycline 20mg  twice daily and was taking spironolactone 25mg . Lab order for potassium was never received at lab so she was not able to get refills for spironolactone. Patient advises HS is coming down from a flare right now.   The following portions of the chart were reviewed this encounter and updated as appropriate:   Tobacco  Allergies  Meds  Problems  Med Hx  Surg Hx  Fam Hx      Review of Systems:  No other skin or systemic complaints except as noted in HPI or Assessment and Plan.  Objective  Well appearing patient in no apparent distress; mood and affect are within normal limits.  A full examination was performed including scalp, head, eyes, ears, nose, lips, neck, chest, axillae, abdomen, back, buttocks, bilateral upper extremities, bilateral lower extremities, hands, feet, fingers, toes, fingernails, and toenails. All findings within normal limits unless otherwise noted below.  Objective  Inguinal Folds: Few erythematous nodules and papules at inguinal folds, scars under breasts  Objective  Left Thigh x 5, left lateral thigh x 1 (6): Erythematous thin papules/macules with gritty scale.    Assessment & Plan  Hidradenitis suppurativa Inguinal Folds  Chronic condition with expected duration over one year. Condition is bothersome to patient. Currently flared.  Restart spironolactone 25mg  once daily. Patient will have potassium checked in 4 weeks.  Continue doxycycline 20mg  twice daily with food. Continue clindamycin lotion daily. Continue HibaClens daily.   Spironolactone can cause increased urination and cause blood pressure to decrease. Please watch for  signs of lightheadedness and be cautious when changing position. It can sometimes cause breast tenderness or an irregular period in premenopausal women. It can also increase potassium. The increase in potassium usually is not a concern unless you are taking other medicines that also increase potassium, so please be sure your doctor knows all of the other medications you are taking. This medication should not be taken by pregnant women.  This medicine should also not be taken together with sulfa drugs like Bactrim (trimethoprim/sulfamethexazole).   Doxycycline should be taken with food to prevent nausea. Do not lay down for 30 minutes after taking. Be cautious with sun exposure and use good sun protection while on this medication. Pregnant women should not take this medication.   Hidradenitis Suppurativa is a chronic; persistent; non-curable, but treatable condition due to abnormal inflamed sweat glands in the body folds (axilla, inframammary, groin, medial thighs), causing recurrent painful cysts and scarring. It can be associated with severe scarring acne and cysts; abscesses and scarring of scalp. The goal is control and prevention of flares, as it is not curable. Scars are permanent and can be thickened. Treatment may include daily use of topical medication and oral antibiotics.  Oral isotretinoin may also be helpful.  For more severe cases, Humira (a biologic injection) may be prescribed to decrease the inflammatory process and prevent flares.  When indicated, inflamed cysts may also be treated surgically.  Other Related Procedures Potassium  Ordered Medications: clindamycin (CLEOCIN-T) 1 % lotion  Reordered Medications doxycycline (PERIOSTAT) 20 MG tablet  Other Related Medications spironolactone (ALDACTONE) 25 MG tablet  AK (actinic keratosis) (6) Left Thigh x  5, left lateral thigh x 1  Prior to procedure, discussed risks of blister formation, small wound, skin dyspigmentation, or rare  scar following cryotherapy.    Destruction of lesion - Left Thigh x 5, left lateral thigh x 1  Destruction method: cryotherapy   Informed consent: discussed and consent obtained   Lesion destroyed using liquid nitrogen: Yes   Cryotherapy cycles:  2 Outcome: patient tolerated procedure well with no complications   Post-procedure details: wound care instructions given     Lentigines - Scattered tan macules - Discussed due to sun exposure - Benign, observe - Call for any changes  Seborrheic Keratoses - Stuck-on, waxy, tan-brown papules and plaques  - Discussed benign etiology and prognosis. - Observe - Call for any changes  Melanocytic Nevi - Tan-brown and/or pink-flesh-colored symmetric macules and papules - Benign appearing on exam today - Observation - Call clinic for new or changing moles - Recommend daily use of broad spectrum spf 30+ sunscreen to sun-exposed areas.   Hemangiomas - Red papules - Discussed benign nature - Observe - Call for any changes  Actinic Damage - Chronic, secondary to cumulative UV/sun exposure - diffuse scaly erythematous macules with underlying dyspigmentation - Recommend daily broad spectrum sunscreen SPF 30+ to sun-exposed areas, reapply every 2 hours as needed.  - Call for new or changing lesions.  Skin cancer screening performed today.  History of Basal Cell Carcinoma of the Skin - No evidence of recurrence today at left calf, Sept 2019 - Recommend regular full body skin exams - Recommend daily broad spectrum sunscreen SPF 30+ to sun-exposed areas, reapply every 2 hours as needed.  - Call if any new or changing lesions are noted between office visits  History of PreCancerous Actinic Keratosis  - sites of previously treated PreCancerous Actinic Keratosis clear today. - these may recur and new lesions may form requiring treatment to prevent transformation into skin cancer - observe for new or changing spots and contact [facility] for appointment if occur - photoprotection with sun protective clothing; sunglasses and broad spectrum sunscreen with SPF of at least 30 + and frequent self skin exams recommended - yearly exams by a dermatologist recommended for persons with history of PreCancerous Actinic Keratoses  Return in about 2 months (around 09/07/2020) for AK and HS follow up.  Graciella Belton, RMA, am acting as scribe for Forest Gleason, MD .  Documentation: I have reviewed the above documentation for accuracy and completeness, and I agree with the above.  Forest Gleason, MD

## 2020-07-07 NOTE — Patient Instructions (Addendum)
Melanoma ABCDEs  Melanoma is the most dangerous type of skin cancer, and is the leading cause of death from skin disease.  You are more likely to develop melanoma if you:  Have light-colored skin, light-colored eyes, or red or blond hair  Spend a lot of time in the sun  Tan regularly, either outdoors or in a tanning bed  Have had blistering sunburns, especially during childhood  Have a close family member who has had a melanoma  Have atypical moles or large birthmarks  Early detection of melanoma is key since treatment is typically straightforward and cure rates are extremely high if we catch it early.   The first sign of melanoma is often a change in a mole or a new dark spot.  The ABCDE system is a way of remembering the signs of melanoma.  A for asymmetry:  The two halves do not match. B for border:  The edges of the growth are irregular. C for color:  A mixture of colors are present instead of an even brown color. D for diameter:  Melanomas are usually (but not always) greater than 65mm - the size of a pencil eraser. E for evolution:  The spot keeps changing in size, shape, and color.  Please check your skin once per month between visits. You can use a small mirror in front and a large mirror behind you to keep an eye on the back side or your body.   If you see any new or changing lesions before your next follow-up, please call to schedule a visit.  Please continue daily skin protection including broad spectrum sunscreen SPF 30+ to sun-exposed areas, reapplying every 2 hours as needed when you're outdoors.   Doxycycline should be taken with food to prevent nausea. Do not lay down for 30 minutes after taking. Be cautious with sun exposure and use good sun protection while on this medication. Pregnant women should not take this medication.   Spironolactone can cause increased urination and cause blood pressure to decrease. Please watch for signs of lightheadedness and be cautious  when changing position. It can sometimes cause breast tenderness or an irregular period in premenopausal women. It can also increase potassium. The increase in potassium usually is not a concern unless you are taking other medicines that also increase potassium, so please be sure your doctor knows all of the other medications you are taking. This medication should not be taken by pregnant women.  This medicine should also not be taken together with sulfa drugs like Bactrim (trimethoprim/sulfamethexazole).   Cryotherapy Aftercare  . Wash gently with soap and water everyday.   Marland Kitchen Apply Vaseline and Band-Aid daily until healed.  Prior to procedure, discussed risks of blister formation, small wound, skin dyspigmentation, or rare scar following cryotherapy.   Recommend taking Heliocare sun protection supplement daily in sunny weather for additional sun protection. For maximum protection on the sunniest days, you can take up to 2 capsules of regular Heliocare OR take 1 capsule of Heliocare Ultra. For prolonged exposure (such as a full day in the sun), you can repeat your dose of the supplement 4 hours after your first dose. Heliocare can be purchased at [facility] or at [email or web address].   Recommend Nicotinamide 500mg  twice per day to lower risk of non-melanoma skin cancer by approximately 25%.

## 2020-07-20 ENCOUNTER — Other Ambulatory Visit: Payer: Self-pay | Admitting: Internal Medicine

## 2020-07-30 ENCOUNTER — Other Ambulatory Visit: Payer: Self-pay | Admitting: Dermatology

## 2020-07-30 DIAGNOSIS — L732 Hidradenitis suppurativa: Secondary | ICD-10-CM

## 2020-08-02 ENCOUNTER — Other Ambulatory Visit: Payer: Self-pay

## 2020-08-02 ENCOUNTER — Encounter: Payer: Self-pay | Admitting: Internal Medicine

## 2020-08-02 ENCOUNTER — Ambulatory Visit (INDEPENDENT_AMBULATORY_CARE_PROVIDER_SITE_OTHER): Payer: BC Managed Care – PPO | Admitting: Internal Medicine

## 2020-08-02 VITALS — BP 124/86 | HR 88 | Temp 98.4°F | Ht 67.5 in | Wt 187.4 lb

## 2020-08-02 DIAGNOSIS — Z0001 Encounter for general adult medical examination with abnormal findings: Secondary | ICD-10-CM | POA: Diagnosis not present

## 2020-08-02 DIAGNOSIS — Z1159 Encounter for screening for other viral diseases: Secondary | ICD-10-CM | POA: Diagnosis not present

## 2020-08-02 DIAGNOSIS — E782 Mixed hyperlipidemia: Secondary | ICD-10-CM | POA: Diagnosis not present

## 2020-08-02 DIAGNOSIS — Z114 Encounter for screening for human immunodeficiency virus [HIV]: Secondary | ICD-10-CM | POA: Diagnosis not present

## 2020-08-02 DIAGNOSIS — K219 Gastro-esophageal reflux disease without esophagitis: Secondary | ICD-10-CM

## 2020-08-02 DIAGNOSIS — F411 Generalized anxiety disorder: Secondary | ICD-10-CM | POA: Diagnosis not present

## 2020-08-02 DIAGNOSIS — I10 Essential (primary) hypertension: Secondary | ICD-10-CM

## 2020-08-02 DIAGNOSIS — L732 Hidradenitis suppurativa: Secondary | ICD-10-CM

## 2020-08-02 DIAGNOSIS — Z1231 Encounter for screening mammogram for malignant neoplasm of breast: Secondary | ICD-10-CM

## 2020-08-02 DIAGNOSIS — K582 Mixed irritable bowel syndrome: Secondary | ICD-10-CM

## 2020-08-02 LAB — LIPID PANEL
Cholesterol: 223 mg/dL — ABNORMAL HIGH (ref 0–200)
HDL: 52.5 mg/dL (ref >39.00)
NonHDL: 170.71
Total CHOL/HDL Ratio: 4
Triglycerides: 257 mg/dL — ABNORMAL HIGH (ref 0.0–149.0)
VLDL: 51.4 mg/dL — ABNORMAL HIGH (ref 0.0–40.0)

## 2020-08-02 LAB — COMPREHENSIVE METABOLIC PANEL
ALT: 46 U/L — ABNORMAL HIGH (ref 0–35)
AST: 28 U/L (ref 0–37)
Albumin: 4.8 g/dL (ref 3.5–5.2)
Alkaline Phosphatase: 65 U/L (ref 39–117)
BUN: 13 mg/dL (ref 6–23)
CO2: 32 mEq/L (ref 19–32)
Calcium: 10.3 mg/dL (ref 8.4–10.5)
Chloride: 101 mEq/L (ref 96–112)
Creatinine, Ser: 0.67 mg/dL (ref 0.40–1.20)
GFR: 98.81 mL/min (ref >60.00)
Glucose, Bld: 101 mg/dL — ABNORMAL HIGH (ref 70–99)
Potassium: 4.3 mEq/L (ref 3.5–5.1)
Sodium: 139 mEq/L (ref 135–145)
Total Bilirubin: 0.5 mg/dL (ref 0.2–1.2)
Total Protein: 7 g/dL (ref 6.0–8.3)

## 2020-08-02 LAB — CBC
HCT: 43.9 % (ref 36.0–46.0)
Hemoglobin: 14.9 g/dL (ref 12.0–15.0)
MCHC: 34 g/dL (ref 30.0–36.0)
MCV: 89.9 fl (ref 78.0–100.0)
Platelets: 170 10*3/uL (ref 150.0–400.0)
RBC: 4.88 Mil/uL (ref 3.87–5.11)
RDW: 13.1 % (ref 11.5–15.5)
WBC: 7.4 10*3/uL (ref 4.0–10.5)

## 2020-08-02 LAB — HEMOGLOBIN A1C: Hgb A1c MFr Bld: 6 % (ref 4.6–6.5)

## 2020-08-02 LAB — LDL CHOLESTEROL, DIRECT: Direct LDL: 128 mg/dL

## 2020-08-02 NOTE — Progress Notes (Addendum)
Subjective:    Patient ID: Beth Green, female    DOB: Mar 09, 1966, 54 y.o.   MRN: [id number]  HPI  Patient presents the clinic today for her annual exam.  She is also due to follow-up chronic conditions.  GERD: She is not sure what triggers this.  She denies breakthrough on Pantoprazole.  There is no upper GI on file.  Hidradenitis: Managed with Chlorhexidine, Doxycycline and Spironolactone as prescribed.  She follows with dermatology.  HTN: Her BP today is 124/86.  She is taking Lisinopril, HCT and spironolactone as prescribed.  There is no ECG on file.  Anxiety: Chronic, stable on Wellbutrin.  She is not currently seeing a therapist.  She denies depression, SI/HI.  HLD: Her last LDL was 129, triglycerides 221, 07/2019.  She has been taking Simvastatin and fenofibrate as prescribed and denies myalgias.  She tries to consume a low-fat diet.  IBS: Alternating constipation and diarrhea.  She takes Dicyclomine as needed for cramping.   Flu: never Covid: never Tetanus: 08/2016 Pap Smear: hysterectomy Mammogram: > 2 years ago I do not know Colonoscopy 2019, 5 years Vision: as needed Dentist: annually  Diet: She does eat meat She consumes fruits and veggies daily. She tries to avoid fried foods. She drinks mostly water. Exercise:  Review of Systems      Past Medical History:  Diagnosis Date  . Actinic keratosis   . Anemia   . Anxiety   . GAD (generalized anxiety disorder)   . GERD (gastroesophageal reflux disease)   . Head cold   . History of hidradenitis suppurativa   . History of uterine fibroid   . Hypertension   . 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 Vulvar intraepithelial neoplasia (VIN) grade 3     Current Outpatient Medications  Medication Sig Dispense Refill  . Ascorbic Acid (VITAMIN C) 1000 MG tablet Take 1,000 mg by mouth daily.    Marland Kitchen buPROPion (WELLBUTRIN) 75 MG tablet TAKE 1 TABLET BY MOUTH TWICE A DAY 180 tablet 3  .  clindamycin (CLEOCIN-T) 1 % lotion Apply topically daily. 60 mL 5  . diphenhydrAMINE (BENADRYL) 50 MG capsule Take 50 mg by mouth as needed for sleep.    . fenofibrate micronized (LOFIBRA) 67 MG capsule Take 1 capsule (67 mg total) by mouth daily before breakfast. MUST SCHEDULE PHYSICAL 90 capsule 0  . hydrochlorothiazide (MICROZIDE) 12.5 MG capsule TAKE 1 CAPSULE BY MOUTH EVERY DAY 90 capsule 0  . lisinopril (ZESTRIL) 10 MG tablet TAKE 1 TABLET BY MOUTH EVERY DAY 90 tablet 0  . pantoprazole (PROTONIX) 40 MG tablet TAKE 1 TABLET (40 MG TOTAL) BY MOUTH DAILY WITH BREAKFAST. 90 tablet 0  . simvastatin (ZOCOR) 20 MG tablet TAKE 1 TABLET BY MOUTH EVERYDAY AT BEDTIME 90 tablet 0  . spironolactone (ALDACTONE) 25 MG tablet TAKE 1 TABLET BY MOUTH EVERY DAY 30 tablet 0  . Vitamin D, Cholecalciferol, 25 MCG (1000 UT) TABS Take by mouth.    . doxycycline (PERIOSTAT) 20 MG tablet Take 1 tablet (20 mg total) by mouth 2 (two) times daily with a meal. 60 tablet 6   No current facility-administered medications for this visit.    No Known Allergies  Family History  Problem Relation Age of Onset  . Hypertension Paternal Grandfather   . Diabetes Father   . Colon cancer Neg Hx   . Stomach cancer Neg Hx   . Esophageal cancer Neg Hx     Social  History   Socioeconomic History  . Marital status: Married    Spouse name: Not on file  . Number of children: Not on file  . Years of education: Not on file  . Highest education level: Not on file  Occupational History  . Not on file  Tobacco Use  . Smoking status: Current Every Day Smoker    Packs/day: 0.25    Years: 25.00    Pack years: 6.25    Types: Cigarettes  . Smokeless tobacco: Never Used  Vaping Use  . Vaping Use: Never used  Substance and Sexual Activity  . Alcohol use: Yes    Alcohol/week: 10.0 standard drinks    Types: 10 Glasses of wine per week    Comment: occasional  . Drug use: No  . Sexual activity: Yes    Partners: Male    Birth  control/protection: Surgical    Comment: Hysterectomy  Other Topics Concern  . Not on file  Social History Narrative  . Not on file   Social Determinants of Health   Financial Resource Strain: Not on file  Food Insecurity: Not on file  Transportation Needs: Not on file  Physical Activity: Not on file  Stress: Not on file  Social Connections: Not on file  Intimate Partner Violence: Not on file     Constitutional: Denies fever, malaise, fatigue, headache or abrupt weight changes.  HEENT: Denies eye pain, eye redness, ear pain, ringing in the ears, wax buildup, runny nose, nasal congestion, bloody nose, or sore throat. Respiratory: Denies difficulty breathing, shortness of breath, cough or sputum production.   Cardiovascular: Denies chest pain, chest tightness, palpitations or swelling in the hands or feet.  Gastrointestinal: Patient reports alternating constipation and diarrhea. Denies abdominal pain, bloating, or blood in the stool.  GU: Denies urgency, frequency, pain with urination, burning sensation, blood in urine, odor or discharge. Musculoskeletal: Denies decrease in range of motion, difficulty with gait, muscle pain or joint pain and swelling.  Skin: Denies redness, rashes, lesions or ulcercations.  Neurological: Denies dizziness, difficulty with memory, difficulty with speech or problems with balance and coordination.  Psych: Patient has a history of anxiety.  Denies depression, SI/HI.  No other specific complaints in a complete review of systems (except as listed in HPI above).  Objective:   Physical Exam   BP 124/86 (BP Location: Right Arm)   Pulse 88   Temp 98.4 F (36.9 C) (Temporal)   Ht 5' 7.5" (1.715 m)   Wt 187 lb 6.4 oz (85 kg)   SpO2 99%   BMI 28.92 kg/m  Wt Readings from Last 3 Encounters:  08/02/20 187 lb 6.4 oz (85 kg)  04/28/19 187 lb (84.8 kg)  04/22/18 185 lb (83.9 kg)    General: Appears her stated age, well developed, well nourished in  NAD. Skin: Warm, dry and intact. No rashes noted. HEENT: Head: normal shape and size; Eyes: sclera white, no icterus, conjunctiva pink, PERRLA and EOMs intact;  Neck:  Neck supple, trachea midline. No masses, lumps or thyromegaly present.  Cardiovascular: Normal rate and rhythm. S1,S2 noted.  No murmur, rubs or gallops noted. No JVD or BLE edema. No carotid bruits noted. Pulmonary/Chest: Normal effort and positive vesicular breath sounds. No respiratory distress. No wheezes, rales or ronchi noted.  Abdomen: Soft and nontender. Normal bowel sounds. No distention or masses noted. Liver, spleen and kidneys non palpable. Musculoskeletal: Strength 5/5 BUE/BLE.  No difficulty with gait.  Neurological: Alert and oriented. Cranial nerves II-XII  grossly intact. Coordination normal.  Psychiatric: Mood and affect normal. Behavior is normal. Judgment and thought content normal.     BMET    Component Value Date/Time   NA 139 08/02/2020 1044   NA 144 02/15/2017 1158   K 4.3 08/02/2020 1044   CL 101 08/02/2020 1044   CO2 32 08/02/2020 1044   GLUCOSE 101 (H) 08/02/2020 1044   BUN 13 08/02/2020 1044   BUN 10 02/15/2017 1158   CREATININE 0.67 08/02/2020 1044   CALCIUM 10.3 08/02/2020 1044   GFRNONAA >60 05/05/2017 0521   GFRAA >60 05/05/2017 0521    Lipid Panel     Component Value Date/Time   CHOL 223 (H) 08/02/2020 1044   CHOL 258 (H) 02/15/2017 1158   TRIG 257.0 (H) 08/02/2020 1044   HDL 52.50 08/02/2020 1044   HDL 53 02/15/2017 1158   CHOLHDL 4 08/02/2020 1044   VLDL 51.4 (H) 08/02/2020 1044   LDLCALC 141 (H) 02/15/2017 1158    CBC    Component Value Date/Time   WBC 7.4 08/02/2020 1044   RBC 4.88 08/02/2020 1044   HGB 14.9 08/02/2020 1044   HGB 14.8 02/15/2017 1158   HCT 43.9 08/02/2020 1044   HCT 44.0 02/15/2017 1158   PLT 170.0 08/02/2020 1044   PLT 153 02/15/2017 1158   MCV 89.9 08/02/2020 1044   MCV 92 02/15/2017 1158   MCH 31.4 05/01/2017 0523   MCHC 34.0 08/02/2020  1044   RDW 13.1 08/02/2020 1044   RDW 13.2 02/15/2017 1158   LYMPHSABS 1.5 05/01/2017 0523   MONOABS 0.2 05/01/2017 0523   EOSABS 0.0 05/01/2017 0523   BASOSABS 0.0 05/01/2017 0523    Hgb A1C Lab Results  Component Value Date   HGBA1C 6.0 08/02/2020           Assessment & Plan:   Preventative Health Maintenance:  She declines flu shot today Tetanus UTD Encouraged her to get a Covid vaccine She no longer needs Pap smears Mammogram ordered- She will call to schedule  Colon screening UTD Encouraged her to consume a balanced diet exercise regimen Advised her to see an eye doctor and dentist annually We will check CBC, C met, lipid, A1c, HIV and hep C today  RTC in 1 year, sooner if needed [name], NP This visit occurred during the SARS-CoV-2 public health emergency.  Safety protocols were in place, including screening questions prior to the visit, additional usage of staff PPE, and extensive cleaning of exam room while observing appropriate contact time as indicated for disinfecting solutions.

## 2020-08-02 NOTE — Patient Instructions (Signed)

## 2020-08-02 NOTE — Assessment & Plan Note (Signed)
Controlled on Lisinopril, HCTZ and Spironolactone C met today Reinforced DASH diet and exercise weight loss We will monitor

## 2020-08-02 NOTE — Assessment & Plan Note (Signed)
Continue Dicyclomine as needed Encouraged high-fiber diet

## 2020-08-02 NOTE — Assessment & Plan Note (Signed)
Stable on Wellbutrin Support offered

## 2020-08-02 NOTE — Assessment & Plan Note (Signed)
Continue Pantoprazole CBC and C met today

## 2020-08-02 NOTE — Assessment & Plan Note (Signed)
C met and lipid profile today Encouraged her to consume a low-fat diet Continue Simvastatin and Fenofibrate for now

## 2020-08-02 NOTE — Assessment & Plan Note (Signed)
Continue current meds per dermatology

## 2020-08-03 ENCOUNTER — Encounter: Payer: Self-pay | Admitting: Dermatology

## 2020-08-03 ENCOUNTER — Encounter: Payer: Self-pay | Admitting: Internal Medicine

## 2020-08-03 DIAGNOSIS — E782 Mixed hyperlipidemia: Secondary | ICD-10-CM

## 2020-08-03 LAB — HEPATITIS C ANTIBODY
Hepatitis C Ab: NONREACTIVE
SIGNAL TO CUT-OFF: 0.01 (ref <1.00)

## 2020-08-03 LAB — HIV ANTIBODY (ROUTINE TESTING W REFLEX): HIV 1&2 Ab, 4th Generation: NONREACTIVE

## 2020-08-04 MED ORDER — ATORVASTATIN CALCIUM 20 MG PO TABS: 20.0000 mg | ORAL_TABLET | Freq: Every day | ORAL | 0 refills | Status: DC

## 2020-08-04 NOTE — Addendum Note (Signed)
Addended by: Roena Malady on: 08/04/2020 05:31 PM   Modules accepted: Orders

## 2020-08-16 ENCOUNTER — Other Ambulatory Visit: Payer: Self-pay | Admitting: Internal Medicine

## 2020-08-25 ENCOUNTER — Other Ambulatory Visit: Payer: Self-pay | Admitting: Dermatology

## 2020-08-25 DIAGNOSIS — L732 Hidradenitis suppurativa: Secondary | ICD-10-CM

## 2020-09-14 ENCOUNTER — Encounter: Payer: Self-pay | Admitting: Dermatology

## 2020-09-14 ENCOUNTER — Other Ambulatory Visit: Payer: Self-pay

## 2020-09-14 ENCOUNTER — Ambulatory Visit (INDEPENDENT_AMBULATORY_CARE_PROVIDER_SITE_OTHER): Payer: BC Managed Care – PPO | Admitting: Dermatology

## 2020-09-14 DIAGNOSIS — L570 Actinic keratosis: Secondary | ICD-10-CM | POA: Diagnosis not present

## 2020-09-14 DIAGNOSIS — L732 Hidradenitis suppurativa: Secondary | ICD-10-CM

## 2020-09-14 NOTE — Progress Notes (Signed)
Follow-Up Visit   Subjective  Beth Green is a 55 y.o. female who presents for the following: Follow-up (Patient states medication took a little while to start working. Patient started getting some boils the first 1 - 2 months. Patient states she had potassium checked by her pcp and we should be able to see those results. ).  The following portions of the chart were reviewed this encounter and updated as appropriate:  Tobacco  Allergies  Meds  Problems  Med Hx  Surg Hx  Fam Hx      Objective  Well appearing patient in no apparent distress; mood and affect are within normal limits.  A focused examination was performed including inguinal folds, bilateral thighs, bilateral axillae, right inframammary. Relevant physical exam findings are noted in the Assessment and Plan.  Objective  bilateral inguinal folds, bilateral axilla: Inflammatory papule right inguinal fold   Scars bilateral inguinal folds   Scar left axilla   Objective  left thigh x 1 right thigh x 4 (5): Erythematous thin papules/macules with gritty scale.   Assessment & Plan  Hidradenitis suppurativa bilateral inguinal folds, bilateral axilla  Chronic condition with duration over one year. Currently well-controlled for the last 3 weeks.  Continue spironolactone 25mg  once daily.  Potassium labs reviewed today - within normal limits  doxycycline 20mg  twice daily with food. Continue clindamycin lotion daily. Continue HibaClens daily.   140/83 bp today  If clear for 2 months drop down doxycycline to 1 pill daily.    If flares, she will call and we may consider increasing spironolactone to 50 mg daily.  Spironolactone can cause increased urination and cause blood pressure to decrease. Please watch for signs of lightheadedness and be cautious when changing position. It can sometimes cause breast tenderness or an irregular period in premenopausal women. It can also increase potassium. The increase in potassium  usually is not a concern unless you are taking other medicines that also increase potassium, so please be sure your doctor knows all of the other medications you are taking. This medication should not be taken by pregnant women.  This medicine should also not be taken together with sulfa drugs like Bactrim (trimethoprim/sulfamethexazole).    Doxycycline should be taken with food to prevent nausea. Do not lay down for 30 minutes after taking. Be cautious with sun exposure and use good sun protection while on this medication. Pregnant women should not take this medication.    Hidradenitis Suppurativa is a chronic; persistent; non-curable, but treatable condition due to abnormal inflamed sweat glands in the body folds (axilla, inframammary, groin, medial thighs), causing recurrent painful cysts and scarring. It can be associated with severe scarring acne and cysts; abscesses and scarring of scalp. The goal is control and prevention of flares, as it is not curable. Scars are permanent and can be thickened. Treatment may include daily use of topical medication and oral antibiotics.  Oral isotretinoin may also be helpful.  For more severe cases, Humira (a biologic injection) may be prescribed to decrease the inflammatory process and prevent flares.  When indicated, inflamed cysts may also be treated surgically.  Other Related Medications doxycycline (PERIOSTAT) 20 MG tablet clindamycin (CLEOCIN-T) 1 % lotion spironolactone (ALDACTONE) 25 MG tablet  Actinic keratosis (5) left thigh x 1 right thigh x 4  Prior to procedure, discussed risks of blister formation, small wound, skin dyspigmentation, or rare scar following cryotherapy.    Destruction of lesion - left thigh x 1 right thigh x 4  Destruction method: cryotherapy   Informed consent: discussed and consent obtained   Lesion destroyed using liquid nitrogen: Yes   Cryotherapy cycles:  2 Outcome: patient tolerated procedure well with no complications    Post-procedure details: wound care instructions given    Return in about 4 months (around 01/12/2021), or tbse and follow up on hs.  I, Ruthell Rummage, CMA, am acting as scribe for Forest Gleason, MD.  Documentation: I have reviewed the above documentation for accuracy and completeness, and I agree with the above.  Forest Gleason, MD

## 2020-09-14 NOTE — Patient Instructions (Addendum)
Doxycycline should be taken with food to prevent nausea. Do not lay down for 30 minutes after taking. Be cautious with sun exposure and use good sun protection while on this medication. Pregnant women should not take this medication.   Spironolactone can cause increased urination and cause blood pressure to decrease. Please watch for signs of lightheadedness and be cautious when changing position. It can sometimes cause breast tenderness or an irregular period in premenopausal women. It can also increase potassium. The increase in potassium usually is not a concern unless you are taking other medicines that also increase potassium, so please be sure your doctor knows all of the other medications you are taking. This medication should not be taken by pregnant women.  This medicine should also not be taken together with sulfa drugs like Bactrim (trimethoprim/sulfamethexazole).   Recommend Nicotinamide 500mg  twice per day to lower risk of non-melanoma skin cancer by approximately 25%.   Recommend taking Heliocare sun protection supplement daily in sunny weather for additional sun protection. For maximum protection on the sunniest days, you can take up to 2 capsules of regular Heliocare OR take 1 capsule of Heliocare Ultra. For prolonged exposure (such as a full day in the sun), you can repeat your dose of the supplement 4 hours after your first dose. Heliocare can be purchased at [facility] or at [email or web address].   Cryotherapy Aftercare  . Wash gently with soap and water everyday.   Marland Kitchen Apply Vaseline and Band-Aid daily until healed.

## 2020-09-17 ENCOUNTER — Other Ambulatory Visit: Payer: Self-pay | Admitting: Dermatology

## 2020-09-17 DIAGNOSIS — L732 Hidradenitis suppurativa: Secondary | ICD-10-CM

## 2020-09-29 ENCOUNTER — Other Ambulatory Visit: Payer: Self-pay | Admitting: Internal Medicine

## 2020-09-30 ENCOUNTER — Other Ambulatory Visit: Payer: Self-pay | Admitting: Internal Medicine

## 2020-10-12 ENCOUNTER — Other Ambulatory Visit: Payer: Self-pay | Admitting: Internal Medicine

## 2020-11-08 ENCOUNTER — Other Ambulatory Visit: Payer: Self-pay | Admitting: Internal Medicine

## 2020-12-25 ENCOUNTER — Other Ambulatory Visit: Payer: Self-pay | Admitting: Internal Medicine

## 2020-12-26 ENCOUNTER — Other Ambulatory Visit: Payer: Self-pay | Admitting: Internal Medicine

## 2021-01-04 NOTE — Telephone Encounter (Signed)
Refill request Pantoprazole Last refill 09/30/20 #90 Last office visit 07/26/20 No upcoming appointment scheduled

## 2021-01-04 NOTE — Telephone Encounter (Signed)
Refill request HCTZ Last office visit 08/02/20 Last refill 09/29/20 #90

## 2021-01-11 ENCOUNTER — Ambulatory Visit: Payer: BC Managed Care – PPO | Admitting: Dermatology

## 2021-01-29 ENCOUNTER — Other Ambulatory Visit: Payer: Self-pay | Admitting: Dermatology

## 2021-01-29 DIAGNOSIS — L732 Hidradenitis suppurativa: Secondary | ICD-10-CM

## 2021-02-04 ENCOUNTER — Other Ambulatory Visit: Payer: Self-pay | Admitting: Internal Medicine

## 2021-02-04 NOTE — Telephone Encounter (Signed)
Pt of [facility]

## 2021-02-07 NOTE — Telephone Encounter (Signed)
LAST APPOINTMENT DATE: 08/02/2020   NEXT APPOINTMENT DATE: Visit date not found    LAST REFILL: 11/09/2020  QTY: 90 no rf

## 2021-02-26 ENCOUNTER — Other Ambulatory Visit: Payer: Self-pay | Admitting: Dermatology

## 2021-02-26 DIAGNOSIS — L732 Hidradenitis suppurativa: Secondary | ICD-10-CM

## 2021-03-30 ENCOUNTER — Other Ambulatory Visit: Payer: Self-pay | Admitting: Family Medicine

## 2021-03-30 DIAGNOSIS — F411 Generalized anxiety disorder: Secondary | ICD-10-CM

## 2021-04-01 ENCOUNTER — Other Ambulatory Visit: Payer: Self-pay | Admitting: Family

## 2021-04-08 ENCOUNTER — Other Ambulatory Visit: Payer: Self-pay | Admitting: Family

## 2021-05-11 ENCOUNTER — Other Ambulatory Visit: Payer: Self-pay | Admitting: Family

## 2021-06-28 ENCOUNTER — Other Ambulatory Visit: Payer: Self-pay | Admitting: Internal Medicine

## 2021-06-28 NOTE — Telephone Encounter (Signed)
Name of Medication: lisinopril 10 mg Name of Pharmacy: [facility] or Written Date and Quantity: #90 x 2 on 10/12/20 Last Office Visit and Type: 08/02/2020 annual Next Office Visit and Type: 08/08/2021 TOC to Beth Garret NP.  Sending request to [place]

## 2021-07-10 ENCOUNTER — Encounter: Payer: BC Managed Care – PPO | Admitting: Nurse Practitioner

## 2021-08-08 ENCOUNTER — Encounter: Payer: BC Managed Care – PPO | Admitting: Nurse Practitioner

## 2021-08-31 ENCOUNTER — Encounter: Payer: Self-pay | Admitting: Family

## 2021-08-31 ENCOUNTER — Ambulatory Visit (INDEPENDENT_AMBULATORY_CARE_PROVIDER_SITE_OTHER): Payer: BC Managed Care – PPO | Admitting: Family

## 2021-08-31 ENCOUNTER — Other Ambulatory Visit: Payer: Self-pay

## 2021-08-31 VITALS — BP 142/86 | HR 85 | Temp 96.3°F | Ht 67.5 in | Wt 195.0 lb

## 2021-08-31 DIAGNOSIS — K582 Mixed irritable bowel syndrome: Secondary | ICD-10-CM

## 2021-08-31 DIAGNOSIS — F411 Generalized anxiety disorder: Secondary | ICD-10-CM

## 2021-08-31 DIAGNOSIS — K219 Gastro-esophageal reflux disease without esophagitis: Secondary | ICD-10-CM

## 2021-08-31 DIAGNOSIS — Z1231 Encounter for screening mammogram for malignant neoplasm of breast: Secondary | ICD-10-CM | POA: Diagnosis not present

## 2021-08-31 DIAGNOSIS — E782 Mixed hyperlipidemia: Secondary | ICD-10-CM

## 2021-08-31 DIAGNOSIS — I10 Essential (primary) hypertension: Secondary | ICD-10-CM

## 2021-08-31 DIAGNOSIS — R739 Hyperglycemia, unspecified: Secondary | ICD-10-CM

## 2021-08-31 DIAGNOSIS — E559 Vitamin D deficiency, unspecified: Secondary | ICD-10-CM | POA: Diagnosis not present

## 2021-08-31 DIAGNOSIS — L732 Hidradenitis suppurativa: Secondary | ICD-10-CM

## 2021-08-31 DIAGNOSIS — Z720 Tobacco use: Secondary | ICD-10-CM

## 2021-08-31 MED ORDER — LISINOPRIL 10 MG PO TABS: 10.0000 mg | ORAL_TABLET | Freq: Every day | ORAL | 1 refills | Status: DC

## 2021-08-31 MED ORDER — PANTOPRAZOLE SODIUM 40 MG PO TBEC: 40.0000 mg | DELAYED_RELEASE_TABLET | Freq: Every day | ORAL | 1 refills | Status: DC

## 2021-08-31 MED ORDER — HYDROCHLOROTHIAZIDE 12.5 MG PO CAPS: 12.5000 mg | ORAL_CAPSULE | Freq: Every day | ORAL | 1 refills | Status: DC

## 2021-08-31 MED ORDER — BUPROPION HCL ER (XL) 150 MG PO TB24: 150.0000 mg | ORAL_TABLET | Freq: Every day | ORAL | 1 refills | Status: DC

## 2021-08-31 MED ORDER — ATORVASTATIN CALCIUM 20 MG PO TABS: 20.0000 mg | ORAL_TABLET | Freq: Every day | ORAL | 1 refills | Status: DC

## 2021-08-31 NOTE — Assessment & Plan Note (Signed)
Smoking cessation instruction/counseling given:  counseled patient on the dangers of tobacco use, advised patient to stop smoking, and reviewed strategies to maximize success 

## 2021-08-31 NOTE — Assessment & Plan Note (Signed)
Patient is stop Nexium and refill her pantoprazole 40 mg once daily.  I did advise patient that she is at higher risk for development of osteopenia in her osteoporosis with PPI chronic use.  We have discussed potentially trying to wean her down from this dosage and then future as well is really focusing on her diet to decrease and or avoid spicy foods fried fatty foods chocolate and/or caffeine as they can contribute to heartburn.  Also advised patient to try to not eat 2 hours prior to bed as this can also contribute to worsening symptoms.  If she is unable to wean down in the future we may consider referral again to the GI to look into this a little bit more.

## 2021-08-31 NOTE — Assessment & Plan Note (Signed)
A1c ordered patient to get and then will pend results.  Patient to work on a diabetic diet and exercise tolerated

## 2021-08-31 NOTE — Patient Instructions (Addendum)
I do suggest you call your OBGYN to see if you should be having regular annual follow ups due to your history.   A diagnostic test for screening mammogram was ordered for today, and requested to be performed at [facility]. I have sent the order over to the facility.  Please give this center a call, and schedule your appointment to have this test completed. Once results are received, I will be in touch.   Please follow up with your dermatologist as scheduled.   Come back for future lab orders, completely fasting.   It was a pleasure seeing you today! Please do not hesitate to reach out with any questions and or concerns.  Regards,   Eugenia Pancoast FNP-C

## 2021-08-31 NOTE — Progress Notes (Signed)
Established Patient Office Visit  Subjective:  Patient ID: Beth Green, female    DOB: 1966/04/06  Age: 56 y.o. MRN: [id number]  CC:  Chief Complaint  Patient presents with   Transitions Of Care    HPI [name] is here for a transition of care visit.  Prior provider was: [name], NP.  Pt is without acute concerns.   Pt wants shingles vaccination.  Colonoscopy: 2019: benign polyps and internal hemorrhoids, repeat in five years.  Mammogram 10/18/20 no concerns for malignancy, does annually.   chronic concerns:  Hidradenitis suppurativa: was followoing with dermatology, in the process of seeing another for a second option on march 20th, 2023. Scheduled in [place] with [facility].   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 Kennon Rounds, [facility]. Last seen in office 2019, didn't go back for office f/u. Likely over due pt will call to f/u for appt.   HTN: taking lisinopril 10 mg however ran out of hctz 12.5 mg once daily. She would like to get back on this. Was doing well with blood pressure, she doesn't check it much but 'can feel when it is elevated' which she feels now. No headache, blurry vision.    BP Readings from Last 3 Encounters:  08/31/21 (!) 142/86  08/02/20 124/86  10/28/19 111/77     GERD: Has temporarily been taking over-the-counter Nexium which is not helping as much she was on pantoprazole 40 mg once daily but ran out of the prescription.  She does eat spicy foods often and enjoys them.  Hidradenitis suppurativa: Patient following with dermatology has a second consult coming up and will follow with them and [facility].  She has had multiple irrigation and drainage in the past.  She is not fully convinced that she is getting the appropriate regimen for treatment with her dermatologist at current still pending follow-up with a second, appointment is October 31, 2021  Smoking: Patient is a chronic  cigarette smoker has thought about quitting in the past however not sure how she can proceed.  She has been taking bupropion 75 mg twice a day for her anxiety however this dosage is not helping her desire to quit.  She is decreasing the frequency however.  IBS with diarrhea and constipation: Patient states that she often experiences symptoms related to that she has had a colonoscopy and follow-up in the past with GI and suggested IBS.  She is not following a low FODMAP diet.    Hyperlipidemia: Patient was taking atorvastatin 20 mg once daily as well as fenofibrate 75 however she ran out of both medications.    Generalized anxiety disorder: Initially started by OB/GYN on bupropion 75 mg twice daily tolerating well however she states at times there is increased stress especially as there is increased care burning from her father and her mom having to care for him.  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, excised 04/30/2018   Vulvar intraepithelial neoplasia (VIN) grade 3     Past Surgical History:  Procedure Laterality Date   INCISE AND DRAIN ABCESS  08/2016   spider bite (right  index finger)   INCISION AND DRAINAGE ABSCESS N/A 04/28/2017   Procedure: INCISION AND DRAINAGE ABSCESS of Vulva;  Surgeon: Jonnie Kind, MD;  Location: [facility];  Service: Gynecology;  Laterality: N/A;   INCISION AND DRAINAGE ABSCESS N/A 04/30/2017   Procedure: INCISION AND DRAINAGE of Vulvar ABSCESS;  Surgeon: Jonnie Kind, MD;  Location: [facility];  Service: Gynecology;  Laterality: N/A;   LAPAROSCOPIC ASSISTED VAGINAL HYSTERECTOMY  2007   LESION REMOVAL N/A 04/18/2017   Procedure: EXCISION VAGINAL LESION - Vulvar Mass;  Surgeon: Donnamae Jude, MD;  Location: [facility];  Service: Gynecology;  Laterality: N/A;    Family History  Problem Relation Age of Onset   Diabetes Father    Kidney disease Father    Hypertension Paternal Grandfather    Colon cancer Neg Hx    Stomach cancer Neg Hx    Esophageal cancer Neg Hx     Social History   Socioeconomic History   Marital status: Married    Spouse name: Not on file   Number of children: Not on file   Years of education: Not on file   Highest education level: Not on file  Occupational History   Occupation: Secretary/administrator  Tobacco Use   Smoking status: Every Day    Packs/day: 0.25    Years: 25.00    Pack years: 6.25    Types: Cigarettes   Smokeless tobacco: Never   Tobacco comments:    D/w her about this, she states she occasionally considers quitting, has decreased where she smokes and how frequently.   Vaping Use   Vaping Use: Never used  Substance and Sexual Activity   Alcohol use: Yes    Alcohol/week: 15.0 standard drinks    Types: 10 Glasses of wine, 5 Shots of liquor per week    Comment: wine/bourbon 5 days a week   Drug use: No   Sexual activity: Yes    Partners: Male    Birth control/protection: Surgical    Comment: Hysterectomy  Other Topics Concern   Not on file  Social History Narrative   Not on file   Social Determinants of Health   Financial Resource Strain: Not on file  Food Insecurity: Not on file  Transportation Needs: Not on file  Physical Activity: Not on file  Stress: Not on file  Social Connections: Not on file  Intimate Partner Violence: Not on file    Outpatient Medications Prior to Visit  Medication Sig Dispense Refill   diphenhydrAMINE (BENADRYL) 50 MG capsule Take 50 mg by mouth as needed for sleep.     Multiple Vitamins-Minerals (CENTRUM ADULTS PO)      Omega-3 Fatty Acids (FISH OIL) 1200 MG CAPS      atorvastatin (LIPITOR) 20 MG tablet TAKE 1 TABLET BY MOUTH EVERY DAY 90 tablet 0   buPROPion (WELLBUTRIN)  75 MG tablet TAKE 1 TABLET BY MOUTH TWICE A DAY 180 tablet 3   esomeprazole (NEXIUM) 20 MG packet      fenofibrate micronized (LOFIBRA) 67 MG capsule Take 1 capsule (67 mg total) by mouth daily before breakfast. MUST SCHEDULE PHYSICAL 90 capsule 0   hydrochlorothiazide (MICROZIDE) 12.5 MG capsule TAKE 1 CAPSULE BY MOUTH EVERY DAY 90 capsule 0   lisinopril (ZESTRIL) 10 MG tablet TAKE 1 TABLET BY MOUTH EVERY DAY 90 tablet 0   pantoprazole (PROTONIX) 40 MG tablet TAKE 1 TABLET BY MOUTH DAILY WITH BREAKFAST. 90 tablet 0   Ascorbic Acid (VITAMIN C)  1000 MG tablet Take 1,000 mg by mouth daily.     doxycycline (PERIOSTAT) 20 MG tablet Take 1 tablet (20 mg total) by mouth 2 (two) times daily with a meal. 60 tablet 6   spironolactone (ALDACTONE) 25 MG tablet TAKE 1 TABLET BY MOUTH EVERY DAY 30 tablet 0   Vitamin D, Cholecalciferol, 25 MCG (1000 UT) TABS Take by mouth.     No facility-administered medications prior to visit.    No Known Allergies  ROS Review of Systems  Psychiatric/Behavioral:  Positive for agitation (at times). Negative for suicidal ideas. The patient is nervous/anxious.   Respiratory:  Negative for shortness of breath.   Cardiovascular:  Negative for chest pain and palpitations.  Gastrointestinal:  Negative for constipation and diarrhea.  Genitourinary:  Negative for dysuria, frequency and urgency.  Musculoskeletal:  Negative for myalgias.    All other systems reviewed and are negative.    Objective:    Physical Exam  Gen: NAD, resting comfortably CV: RRR with no murmurs appreciated Pulm: NWOB, CTAB with no crackles, wheezes, or rhonchi Skin: warm, dry Psych: Normal affect and thought content  BP (!) 142/86    Pulse 85    Temp (!) 96.3 F (35.7 C) (Temporal)    Ht 5' 7.5" (1.715 m)    Wt 195 lb (88.5 kg)    SpO2 96%    BMI 30.09 kg/m  Wt Readings from Last 3 Encounters:  08/31/21 195 lb (88.5 kg)  08/02/20 187 lb 6.4 oz (85 kg)  04/28/19 187 lb (84.8 kg)      Health Maintenance Due  Topic Date Due   Zoster Vaccines- Shingrix (1 of 2) Never done    There are no preventive care reminders to display for this patient.  Lab Results  Component Value Date   TSH 1.26 04/28/2019   Lab Results  Component Value Date   WBC 7.4 08/02/2020   HGB 14.9 08/02/2020   HCT 43.9 08/02/2020   MCV 89.9 08/02/2020   PLT 170.0 08/02/2020   Lab Results  Component Value Date   NA 139 08/02/2020   K 4.3 08/02/2020   CO2 32 08/02/2020   GLUCOSE 101 (H) 08/02/2020   BUN 13 08/02/2020   CREATININE 0.67 08/02/2020   BILITOT 0.5 08/02/2020   ALKPHOS 65 08/02/2020   AST 28 08/02/2020   ALT 46 (H) 08/02/2020   PROT 7.0 08/02/2020   ALBUMIN 4.8 08/02/2020   CALCIUM 10.3 08/02/2020   ANIONGAP 4 (L) 04/30/2017   GFR 98.81 08/02/2020   Lab Results  Component Value Date   CHOL 223 (H) 08/02/2020   Lab Results  Component Value Date   HDL 52.50 08/02/2020   Lab Results  Component Value Date   LDLCALC 141 (H) 02/15/2017   Lab Results  Component Value Date   TRIG 257.0 (H) 08/02/2020   Lab Results  Component Value Date   CHOLHDL 4 08/02/2020   Lab Results  Component Value Date   HGBA1C 6.0 08/02/2020      Assessment & Plan:   Problem List Items Addressed This Visit       Cardiovascular and Mediastinum   Benign essential hypertension    Refill both lisinopril and hydrochlorothiazide and patient to resume taking as directed.  Patient to follow low-sodium diet.  There is mild lower extremity edema swelling and I informed patient hydrochlorothiazide will likely help with this.  She does stand for long hours at her work.  Recommended compression stockings as well. Pt  advised of the following:  Continue medication as prescribed. Monitor blood pressure periodically and/or when you feel symptomatic. Goal is <130/90 on average. Ensure that you have rested for 30 minutes prior to checking your blood pressure. Record your readings and bring them  to your next visit if necessary.work on a low sodium diet.       Relevant Medications   lisinopril (ZESTRIL) 10 MG tablet   hydrochlorothiazide (MICROZIDE) 12.5 MG capsule   atorvastatin (LIPITOR) 20 MG tablet     Digestive   GERD (gastroesophageal reflux disease)    Patient is stop Nexium and refill her pantoprazole 40 mg once daily.  I did advise patient that she is at higher risk for development of osteopenia in her osteoporosis with PPI chronic use.  We have discussed potentially trying to wean her down from this dosage and then future as well is really focusing on her diet to decrease and or avoid spicy foods fried fatty foods chocolate and/or caffeine as they can contribute to heartburn.  Also advised patient to try to not eat 2 hours prior to bed as this can also contribute to worsening symptoms.  If she is unable to wean down in the future we may consider referral again to the GI to look into this a little bit more.      Relevant Medications   pantoprazole (PROTONIX) 40 MG tablet   Irritable bowel syndrome with both constipation and diarrhea    Discussed with patient about FODMAP diet as well as provided her with a handout for this going forward.      Relevant Medications   pantoprazole (PROTONIX) 40 MG tablet     Musculoskeletal and Integument   Hidradenitis suppurativa    Patient to follow-up with dermatology as scheduled        Other   Tobacco abuse    Smoking cessation instruction/counseling given:  counseled patient on the dangers of tobacco use, advised patient to stop smoking, and reviewed strategies to maximize success       Generalized anxiety disorder    Patient is continue bupropion 75 mg twice daily and to start 150 mg XL once daily.  We may consider increasing the steps to twice daily if patient feels necessary as well as an additive side effect of possibly being to stop smoking.        Relevant Medications   buPROPion (WELLBUTRIN XL) 150 MG 24 hr tablet    Other Relevant Orders   CBC w/Diff   HLD (hyperlipidemia)    Lipid panel ordered patient making appointment to come back while she is completely fasting so we can have an accurate measurement of her cholesterol.  Refilled atorvastatin patient does start taking nightly as directed however I am not refilling her fenofibrate at current as not recommended to take for the fenofibrate with a statin.  We will see where her cholesterol has and if needed increase her atorvastatin to 40 mg.  Patient to work on a low-cholesterol diet and exercise as tolerated      Relevant Medications   lisinopril (ZESTRIL) 10 MG tablet   hydrochlorothiazide (MICROZIDE) 12.5 MG capsule   atorvastatin (LIPITOR) 20 MG tablet   Other Relevant Orders   Comprehensive metabolic panel   Lipid panel   Vitamin D deficiency    Vitamin D ordered pending results.      Relevant Orders   VITAMIN D 25 Hydroxy (Vit-D Deficiency, Fractures)   Hyperglycemia    A1c ordered patient to get and  then will pend results.  Patient to work on a diabetic diet and exercise tolerated      Relevant Orders   Hemoglobin A1c   Screening mammogram for breast cancer - Primary    Mammogram ordered patient given the information and advised to call after October 18, 2021 for appointment.      Relevant Orders   MM 3D SCREEN BREAST BILATERAL    Meds ordered this encounter  Medications   lisinopril (ZESTRIL) 10 MG tablet    Sig: Take 1 tablet (10 mg total) by mouth daily.    Dispense:  90 tablet    Refill:  1    Order Specific Question:   Supervising Provider    Answer:   BEDSOLE, AMY E [[id number]]   hydrochlorothiazide (MICROZIDE) 12.5 MG capsule    Sig: Take 1 capsule (12.5 mg total) by mouth daily.    Dispense:  90 capsule    Refill:  1    Order Specific Question:   Supervising Provider    Answer:   BEDSOLE, AMY E [[id number]]   atorvastatin (LIPITOR) 20 MG tablet    Sig: Take 1 tablet (20 mg total) by mouth daily.    Dispense:  90 tablet     Refill:  1    Order Specific Question:   Supervising Provider    Answer:   BEDSOLE, AMY E [[id number]]   buPROPion (WELLBUTRIN XL) 150 MG 24 hr tablet    Sig: Take 1 tablet (150 mg total) by mouth daily.    Dispense:  90 tablet    Refill:  1    Order Specific Question:   Supervising Provider    Answer:   BEDSOLE, AMY E [[id number]]   pantoprazole (PROTONIX) 40 MG tablet    Sig: Take 1 tablet (40 mg total) by mouth daily with breakfast.    Dispense:  90 tablet    Refill:  1    Order Specific Question:   Supervising Provider    Answer:   Diona Browner, AMY E [[id number]]    Follow-up: Return in about 3 months (around 11/29/2021) for regular follow up on medication.    Eugenia Pancoast, FNP

## 2021-08-31 NOTE — Assessment & Plan Note (Signed)
Lipid panel ordered patient making appointment to come back while she is completely fasting so we can have an accurate measurement of her cholesterol.  Refilled atorvastatin patient does start taking nightly as directed however I am not refilling her fenofibrate at current as not recommended to take for the fenofibrate with a statin.  We will see where her cholesterol has and if needed increase her atorvastatin to 40 mg.  Patient to work on a low-cholesterol diet and exercise as tolerated

## 2021-08-31 NOTE — Assessment & Plan Note (Signed)
Mammogram ordered patient given the information and advised to call after October 18, 2021 for appointment.

## 2021-08-31 NOTE — Assessment & Plan Note (Signed)
Vitamin D ordered pending results.

## 2021-08-31 NOTE — Assessment & Plan Note (Signed)
Patient to follow-up with dermatology as scheduled

## 2021-08-31 NOTE — Assessment & Plan Note (Signed)
Refill both lisinopril and hydrochlorothiazide and patient to resume taking as directed.  Patient to follow low-sodium diet.  There is mild lower extremity edema swelling and I informed patient hydrochlorothiazide will likely help with this.  She does stand for long hours at her work.  Recommended compression stockings as well. Pt advised of the following:  Continue medication as prescribed. Monitor blood pressure periodically and/or when you feel symptomatic. Goal is <130/90 on average. Ensure that you have rested for 30 minutes prior to checking your blood pressure. Record your readings and bring them to your next visit if necessary.work on a low sodium diet.

## 2021-08-31 NOTE — Assessment & Plan Note (Signed)
Patient is continue bupropion 75 mg twice daily and to start 150 mg XL once daily.  We may consider increasing the steps to twice daily if patient feels necessary as well as an additive side effect of possibly being to stop smoking.

## 2021-08-31 NOTE — Assessment & Plan Note (Signed)
Discussed with patient about FODMAP diet as well as provided her with a handout for this going forward.

## 2021-11-23 ENCOUNTER — Other Ambulatory Visit (INDEPENDENT_AMBULATORY_CARE_PROVIDER_SITE_OTHER): Payer: BC Managed Care – PPO

## 2021-11-23 DIAGNOSIS — R739 Hyperglycemia, unspecified: Secondary | ICD-10-CM | POA: Diagnosis not present

## 2021-11-23 DIAGNOSIS — E782 Mixed hyperlipidemia: Secondary | ICD-10-CM | POA: Diagnosis not present

## 2021-11-23 DIAGNOSIS — F411 Generalized anxiety disorder: Secondary | ICD-10-CM

## 2021-11-23 DIAGNOSIS — E559 Vitamin D deficiency, unspecified: Secondary | ICD-10-CM | POA: Diagnosis not present

## 2021-11-23 LAB — CBC WITH DIFFERENTIAL/PLATELET
Basophils Absolute: 0 10*3/uL (ref 0.0–0.1)
Basophils Relative: 0.5 % (ref 0.0–3.0)
Eosinophils Absolute: 0.1 10*3/uL (ref 0.0–0.7)
Eosinophils Relative: 1 % (ref 0.0–5.0)
HCT: 40.6 % (ref 36.0–46.0)
Hemoglobin: 13.7 g/dL (ref 12.0–15.0)
Lymphocytes Relative: 26.3 % (ref 12.0–46.0)
Lymphs Abs: 1.8 10*3/uL (ref 0.7–4.0)
MCHC: 33.7 g/dL (ref 30.0–36.0)
MCV: 88.7 fl (ref 78.0–100.0)
Monocytes Absolute: 0.3 10*3/uL (ref 0.1–1.0)
Monocytes Relative: 4.9 % (ref 3.0–12.0)
Neutro Abs: 4.6 10*3/uL (ref 1.4–7.7)
Neutrophils Relative %: 67.3 % (ref 43.0–77.0)
Platelets: 157 10*3/uL (ref 150.0–400.0)
RBC: 4.57 Mil/uL (ref 3.87–5.11)
RDW: 13.1 % (ref 11.5–15.5)
WBC: 6.9 10*3/uL (ref 4.0–10.5)

## 2021-11-23 LAB — COMPREHENSIVE METABOLIC PANEL
ALT: 45 U/L — ABNORMAL HIGH (ref 0–35)
AST: 30 U/L (ref 0–37)
Albumin: 4.7 g/dL (ref 3.5–5.2)
Alkaline Phosphatase: 62 U/L (ref 39–117)
BUN: 13 mg/dL (ref 6–23)
CO2: 32 mEq/L (ref 19–32)
Calcium: 9.5 mg/dL (ref 8.4–10.5)
Chloride: 102 mEq/L (ref 96–112)
Creatinine, Ser: 0.64 mg/dL (ref 0.40–1.20)
GFR: 98.99 mL/min (ref >60.00)
Glucose, Bld: 104 mg/dL — ABNORMAL HIGH (ref 70–99)
Potassium: 4.3 mEq/L (ref 3.5–5.1)
Sodium: 142 mEq/L (ref 135–145)
Total Bilirubin: 0.4 mg/dL (ref 0.2–1.2)
Total Protein: 6.7 g/dL (ref 6.0–8.3)

## 2021-11-23 LAB — LIPID PANEL
Cholesterol: 180 mg/dL (ref 0–200)
HDL: 51 mg/dL (ref >39.00)
NonHDL: 128.99
Total CHOL/HDL Ratio: 4
Triglycerides: 345 mg/dL — ABNORMAL HIGH (ref 0.0–149.0)
VLDL: 69 mg/dL — ABNORMAL HIGH (ref 0.0–40.0)

## 2021-11-23 LAB — VITAMIN D 25 HYDROXY (VIT D DEFICIENCY, FRACTURES): VITD: 33.05 ng/mL (ref 30.00–100.00)

## 2021-11-23 LAB — LDL CHOLESTEROL, DIRECT: Direct LDL: 83 mg/dL

## 2021-11-23 LAB — HEMOGLOBIN A1C: Hgb A1c MFr Bld: 6 % (ref 4.6–6.5)

## 2021-11-24 ENCOUNTER — Other Ambulatory Visit: Payer: Self-pay | Admitting: Family

## 2021-11-24 DIAGNOSIS — E782 Mixed hyperlipidemia: Secondary | ICD-10-CM

## 2021-11-24 MED ORDER — ATORVASTATIN CALCIUM 40 MG PO TABS: 40.0000 mg | ORAL_TABLET | Freq: Every day | ORAL | 3 refills | Status: DC

## 2021-11-24 NOTE — Progress Notes (Signed)
Your repeat triglycerides are pretty elevated at 345 and the goal is less than 150 are you still taking the atorvastatin 20 mg once daily?  I think that we should probably increase this to 40 mg once daily as well as if you are not already taking omega-3 fish oils daily to add this in addition.  Also work on your cholesterol intake as far as triglycerides are concerned which include fatty foods such as whole milks dairies red meats and intake of wine or cheese. ? ?ALT which is a liver function is still mildly elevated but stable for the last 3 years.  We will continue to monitor it at this ? ?You are prediabetic so work on diabetic diet and exercise as tolerated

## 2022-02-18 ENCOUNTER — Other Ambulatory Visit: Payer: Self-pay | Admitting: Family

## 2022-02-18 DIAGNOSIS — I10 Essential (primary) hypertension: Secondary | ICD-10-CM

## 2022-02-18 DIAGNOSIS — K219 Gastro-esophageal reflux disease without esophagitis: Secondary | ICD-10-CM

## 2022-04-19 ENCOUNTER — Other Ambulatory Visit: Payer: Self-pay | Admitting: Family

## 2022-04-19 DIAGNOSIS — I10 Essential (primary) hypertension: Secondary | ICD-10-CM

## 2022-04-19 DIAGNOSIS — F411 Generalized anxiety disorder: Secondary | ICD-10-CM

## 2022-04-19 NOTE — Telephone Encounter (Signed)
Patient was due for follow-up in April please get her scheduled

## 2022-04-23 ENCOUNTER — Encounter: Payer: Self-pay | Admitting: Family

## 2022-04-23 NOTE — Telephone Encounter (Signed)
Appointment made for Wednesday.

## 2022-04-25 ENCOUNTER — Encounter: Payer: Self-pay | Admitting: Family

## 2022-04-25 ENCOUNTER — Ambulatory Visit (INDEPENDENT_AMBULATORY_CARE_PROVIDER_SITE_OTHER): Payer: BC Managed Care – PPO | Admitting: Family

## 2022-04-25 VITALS — BP 140/90 | HR 78 | Temp 98.2°F | Resp 16 | Ht 67.5 in | Wt 194.0 lb

## 2022-04-25 DIAGNOSIS — E782 Mixed hyperlipidemia: Secondary | ICD-10-CM | POA: Diagnosis not present

## 2022-04-25 DIAGNOSIS — R739 Hyperglycemia, unspecified: Secondary | ICD-10-CM

## 2022-04-25 DIAGNOSIS — F411 Generalized anxiety disorder: Secondary | ICD-10-CM | POA: Diagnosis not present

## 2022-04-25 DIAGNOSIS — I499 Cardiac arrhythmia, unspecified: Secondary | ICD-10-CM | POA: Diagnosis not present

## 2022-04-25 DIAGNOSIS — K219 Gastro-esophageal reflux disease without esophagitis: Secondary | ICD-10-CM

## 2022-04-25 DIAGNOSIS — I10 Essential (primary) hypertension: Secondary | ICD-10-CM | POA: Diagnosis not present

## 2022-04-25 DIAGNOSIS — I491 Atrial premature depolarization: Secondary | ICD-10-CM

## 2022-04-25 DIAGNOSIS — Z720 Tobacco use: Secondary | ICD-10-CM

## 2022-04-25 LAB — COMPREHENSIVE METABOLIC PANEL
ALT: 51 U/L — ABNORMAL HIGH (ref 0–35)
AST: 33 U/L (ref 0–37)
Albumin: 4.5 g/dL (ref 3.5–5.2)
Alkaline Phosphatase: 66 U/L (ref 39–117)
BUN: 12 mg/dL (ref 6–23)
CO2: 33 mEq/L — ABNORMAL HIGH (ref 19–32)
Calcium: 9.9 mg/dL (ref 8.4–10.5)
Chloride: 99 mEq/L (ref 96–112)
Creatinine, Ser: 0.66 mg/dL (ref 0.40–1.20)
GFR: 97.97 mL/min (ref >60.00)
Glucose, Bld: 94 mg/dL (ref 70–99)
Potassium: 3.5 mEq/L (ref 3.5–5.1)
Sodium: 142 mEq/L (ref 135–145)
Total Bilirubin: 0.6 mg/dL (ref 0.2–1.2)
Total Protein: 7 g/dL (ref 6.0–8.3)

## 2022-04-25 LAB — LIPID PANEL
Cholesterol: 154 mg/dL (ref 0–200)
HDL: 47.8 mg/dL (ref >39.00)
NonHDL: 105.94
Total CHOL/HDL Ratio: 3
Triglycerides: 331 mg/dL — ABNORMAL HIGH (ref 0.0–149.0)
VLDL: 66.2 mg/dL — ABNORMAL HIGH (ref 0.0–40.0)

## 2022-04-25 LAB — LDL CHOLESTEROL, DIRECT: Direct LDL: 78 mg/dL

## 2022-04-25 LAB — HEMOGLOBIN A1C: Hgb A1c MFr Bld: 6.2 % (ref 4.6–6.5)

## 2022-04-25 MED ORDER — METOPROLOL SUCCINATE ER 25 MG PO TB24: 25.0000 mg | ORAL_TABLET | Freq: Every day | ORAL | 2 refills | Status: DC

## 2022-04-25 MED ORDER — ESCITALOPRAM OXALATE 10 MG PO TABS: 10.0000 mg | ORAL_TABLET | Freq: Every day | ORAL | 0 refills | Status: DC

## 2022-04-25 MED ORDER — LISINOPRIL-HYDROCHLOROTHIAZIDE 20-12.5 MG PO TABS: 1.0000 | ORAL_TABLET | Freq: Every day | ORAL | 3 refills | Status: DC

## 2022-04-25 MED ORDER — BUPROPION HCL ER (XL) 150 MG PO TB24: 150.0000 mg | ORAL_TABLET | Freq: Every day | ORAL | 1 refills | Status: DC

## 2022-04-25 NOTE — Assessment & Plan Note (Signed)
Repeat a1c  Work on diabetic diet

## 2022-04-25 NOTE — Assessment & Plan Note (Signed)
ekg today in office

## 2022-04-25 NOTE — Patient Instructions (Signed)
  Increase lisinopril to 20 mg once daily/HCTZ 12.5  Recommend to start metoprolol 25 mg once daily.   Stop wellbutrin completely., in the next few days start metoprolol. As long as tolerating this, can start lexapro a few days after that.   Start lexapro 10 mg for anxiety and depression. Take 1/2 tablet by mouth once daily for about one week, then increase to 1 full tablet thereafter.   Taking the medicine as directed and not missing any doses is one of the best things you can do to treat youranxiety/depression.  Here are some things to keep in mind:  Side effects (stomach upset, some increased anxiety) may happen before you notice a benefit.  These side effects typically go away over time. Changes to your dose of medicine or a change in medication all together is sometimes necessary Many people will notice an improvement within two weeks but the full effect of the medication can take up to 4-6 weeks Stopping the medication when you start feeling better often results in a return of symptoms. Most people need to be on medication at least 6-12 months If you start having thoughts of hurting yourself or others after starting this medicine, please call me immediately.    A referral was placed today for psychology.  Please let us know if you have not heard back within 2 weeks about the referral.  Stop by the lab prior to leaving today. I will notify you of your results once received.    Regards,   Eugenia Pancoast FNP-C

## 2022-04-25 NOTE — Assessment & Plan Note (Signed)
Increase wellbutrin to 150 mg po bid  Work on anxiety reducing techniques

## 2022-04-25 NOTE — Assessment & Plan Note (Signed)
Increase lisinopril to 20 mg once daily and continue hctz 12.5 mg  Pt advised of the following:  Continue medication as prescribed. Monitor blood pressure periodically and/or when you feel symptomatic. Goal is <130/90 on average. Ensure that you have rested for 30 minutes prior to checking your blood pressure. Record your readings and bring them to your next visit if necessary.work on a low sodium diet.

## 2022-04-25 NOTE — Assessment & Plan Note (Signed)
D/w pt quitting  Can increase wellbutrin to 150 mg bid

## 2022-04-25 NOTE — Assessment & Plan Note (Signed)
Continue atorvastatin 40 mg once daily Ordered lipid panel, pending results. Work on low cholesterol diet and exercise as tolerated  

## 2022-04-25 NOTE — Progress Notes (Signed)
Patient advised in office of frequent PACs and was referred to cardiology as well as started on metoprolol.

## 2022-04-25 NOTE — Assessment & Plan Note (Signed)
PACs today on EKG in office start metoprolol 25 mg once daily Referral placed for cardiology eval treat Advised pt to stop wellbutrin as to not interact poorly with metoprolol

## 2022-04-25 NOTE — Assessment & Plan Note (Signed)
Continue pantoprazole 40 mg once daily Try to decrease and or avoid spicy foods, fried fatty foods, and also caffeine and chocolate as these can increase heartburn symptoms.

## 2022-04-25 NOTE — Progress Notes (Signed)
Established Patient Office Visit  Subjective:  Patient ID: Beth Green, female    DOB: Jan 01, 1966  Age: 56 y.o. MRN: [id number]  CC:  Chief Complaint  Patient presents with   Medication Refill    HPI Beth Green is here today for follow up.   Been rough over the last few months, few family losses that have been hard on her.   Pt is with acute concerns.  PYP:PJKDTO both lisinopril and HCTZ, took both medications around 6 am. Doesn't check her blood pressure at home.   She does report little 'hits of chest pain'.  Doesn't seem like heart burn. Will typically occur right side upper chest and takes her breath away when it occurs. Not anxious when it happens. This will occur almost daily. Has been for the last few weeks.   Anxiety/depression: recent increase to wellbutrin 150 mg once daily.hard to determine if it has been helping because she did recently lose a few family members which has been hard for her. She does not speak to a therapist.   Hyperlipidemia: taking atorvastatin nightly at 40 mg . Tolerating well. Does have mild elevated alt 45 , not overly concerning. Triglycerides were high but did start her fish oils.   Heartburn: stopped nexium and doing much better on pantoprazole 40 mg.   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, excised 04/30/2018   Vulvar intraepithelial neoplasia (VIN) grade 3     Past Surgical History:  Procedure Laterality Date   INCISE AND DRAIN ABCESS  08/2016   spider bite (right index finger)   INCISION AND DRAINAGE ABSCESS N/A 04/28/2017   Procedure: INCISION AND DRAINAGE ABSCESS of Vulva;  Surgeon: Jonnie Kind, MD;  Location: [facility];  Service: Gynecology;   Laterality: N/A;   INCISION AND DRAINAGE ABSCESS N/A 04/30/2017   Procedure: INCISION AND DRAINAGE of Vulvar ABSCESS;  Surgeon: Jonnie Kind, MD;  Location: [facility];  Service: Gynecology;  Laterality: N/A;   LAPAROSCOPIC ASSISTED VAGINAL HYSTERECTOMY  2007   LESION REMOVAL N/A 04/18/2017   Procedure: EXCISION VAGINAL LESION - Vulvar Mass;  Surgeon: Donnamae Jude, MD;  Location: [facility];  Service: Gynecology;  Laterality: N/A;    Family History  Problem Relation Age of Onset   Diabetes Father    Kidney disease Father    Hypertension Paternal Grandfather    Colon cancer Neg Hx    Stomach cancer Neg Hx    Esophageal cancer Neg Hx     Social History   Socioeconomic History   Marital status: Married    Spouse name: Not on file   Number of children: Not on file   Years of education: Not on file   Highest education level: Not on file  Occupational History   Occupation: Secretary/administrator  Tobacco Use   Smoking status: Every Day    Packs/day: 0.25    Years: 25.00    Total pack years: 6.25    Types: Cigarettes   Smokeless tobacco: Never   Tobacco comments:    D/w her about this, she states she occasionally considers quitting, has decreased where she smokes and  how frequently.   Vaping Use   Vaping Use: Never used  Substance and Sexual Activity   Alcohol use: Yes    Alcohol/week: 15.0 standard drinks of alcohol    Types: 10 Glasses of wine, 5 Shots of liquor per week    Comment: wine/bourbon 5 days a week   Drug use: No   Sexual activity: Yes    Partners: Male    Birth control/protection: Surgical    Comment: Hysterectomy  Other Topics Concern   Not on file  Social History Narrative   Not on file   Social Determinants of Health   Financial Resource Strain: Not on file  Food Insecurity: Not on file  Transportation Needs: Not on file  Physical Activity: Not on file  Stress: Not on file  Social Connections: Not on file  Intimate Partner Violence: Not on  file    Outpatient Medications Prior to Visit  Medication Sig Dispense Refill   atorvastatin (LIPITOR) 40 MG tablet Take 1 tablet (40 mg total) by mouth daily. 90 tablet 3   diphenhydrAMINE (BENADRYL) 50 MG capsule Take 50 mg by mouth as needed for sleep.     Multiple Vitamins-Minerals (CENTRUM ADULTS PO)      Omega-3 Fatty Acids (FISH OIL) 1200 MG CAPS      pantoprazole (PROTONIX) 40 MG tablet TAKE 1 TABLET BY MOUTH DAILY WITH BREAKFAST. 90 tablet 1   Probiotic Product (MISC INTESTINAL FLORA REGULAT) CAPS Take 1 capsule by mouth daily.     hydrochlorothiazide (MICROZIDE) 12.5 MG capsule TAKE 1 CAPSULE BY MOUTH EVERY DAY 90 capsule 1   lisinopril (ZESTRIL) 10 MG tablet Take 1 tablet (10 mg total) by mouth daily. 90 tablet 1   buPROPion (WELLBUTRIN XL) 150 MG 24 hr tablet Take 1 tablet (150 mg total) by mouth daily. 90 tablet 1   No facility-administered medications prior to visit.    No Known Allergies      Objective:    Physical Exam Constitutional:      General: She is not in acute distress.    Appearance: Normal appearance. She is obese. She is not ill-appearing, toxic-appearing or diaphoretic.  Cardiovascular:     Rate and Rhythm: Normal rate. Rhythm regularly irregular.     Pulses: Normal pulses.          Dorsalis pedis pulses are 2+ on the right side and 2+ on the left side.       Posterior tibial pulses are 2+ on the right side and 2+ on the left side.     Heart sounds: No murmur heard. Musculoskeletal:     Right lower leg: No edema.     Left lower leg: No edema.  Neurological:     Mental Status: She is alert.     Gen: NAD, resting comfortably HEENT: TMs normal bilaterally. OP clear. No thyromegaly noted.  CV: RRR with no murmurs appreciated Pulm: NWOB, CTAB with no crackles, wheezes, or rhonchi GI: Normal bowel sounds present. Soft, Nontender, Nondistended. MSK: no edema, cyanosis, or clubbing noted Skin: warm, dry Psych: Normal affect and thought  content  BP (!) 140/90   Pulse 78   Temp 98.2 F (36.8 C)   Resp 16   Ht 5' 7.5" (1.715 m)   Wt 194 lb (88 kg)   SpO2 96%   BMI 29.94 kg/m  Wt Readings from Last 3 Encounters:  04/25/22 194 lb (88 kg)  08/31/21 195 lb (88.5 kg)  08/02/20 187 lb 6.4 oz (85  kg)     Health Maintenance Due  Topic Date Due   Zoster Vaccines- Shingrix (1 of 2) Never done   COVID-19 Vaccine (3 - Pfizer risk series) 10/08/2020    There are no preventive care reminders to display for this patient.  Lab Results  Component Value Date   TSH 1.26 04/28/2019   Lab Results  Component Value Date   WBC 6.9 11/23/2021   HGB 13.7 11/23/2021   HCT 40.6 11/23/2021   MCV 88.7 11/23/2021   PLT 157.0 11/23/2021   Lab Results  Component Value Date   NA 142 11/23/2021   K 4.3 11/23/2021   CO2 32 11/23/2021   GLUCOSE 104 (H) 11/23/2021   BUN 13 11/23/2021   CREATININE 0.64 11/23/2021   BILITOT 0.4 11/23/2021   ALKPHOS 62 11/23/2021   AST 30 11/23/2021   ALT 45 (H) 11/23/2021   PROT 6.7 11/23/2021   ALBUMIN 4.7 11/23/2021   CALCIUM 9.5 11/23/2021   ANIONGAP 4 (L) 04/30/2017   GFR 98.99 11/23/2021   Lab Results  Component Value Date   CHOL 180 11/23/2021   Lab Results  Component Value Date   HDL 51.00 11/23/2021   Lab Results  Component Value Date   LDLCALC 141 (H) 02/15/2017   Lab Results  Component Value Date   TRIG 345.0 (H) 11/23/2021   Lab Results  Component Value Date   CHOLHDL 4 11/23/2021   Lab Results  Component Value Date   HGBA1C 6.0 11/23/2021      Assessment & Plan:   Problem List Items Addressed This Visit       Cardiovascular and Mediastinum   Benign essential hypertension    Increase lisinopril to 20 mg once daily and continue hctz 12.5 mg  Pt advised of the following:  Continue medication as prescribed. Monitor blood pressure periodically and/or when you feel symptomatic. Goal is <130/90 on average. Ensure that you have rested for 30 minutes prior to  checking your blood pressure. Record your readings and bring them to your next visit if necessary.work on a low sodium diet.       Relevant Medications   lisinopril-hydrochlorothiazide (ZESTORETIC) 20-12.5 MG tablet   metoprolol succinate (TOPROL-XL) 25 MG 24 hr tablet   Other Relevant Orders   Comprehensive metabolic panel   PAC (premature atrial contraction)    PACs today on EKG in office start metoprolol 25 mg once daily Referral placed for cardiology eval treat Advised pt to stop wellbutrin as to not interact poorly with metoprolol       Relevant Medications   lisinopril-hydrochlorothiazide (ZESTORETIC) 20-12.5 MG tablet   metoprolol succinate (TOPROL-XL) 25 MG 24 hr tablet   Other Relevant Orders   Ambulatory referral to Cardiology     Digestive   GERD (gastroesophageal reflux disease)    Continue pantoprazole 40 mg once daily Try to decrease and or avoid spicy foods, fried fatty foods, and also caffeine and chocolate as these can increase heartburn symptoms.        Relevant Medications   Probiotic Product (MISC INTESTINAL FLORA REGULAT) CAPS     Other   Tobacco abuse    D/w pt quitting  Can increase wellbutrin to 150 mg bid       Generalized anxiety disorder    Increase wellbutrin to 150 mg po bid  Work on anxiety reducing techniques      Relevant Medications   escitalopram (LEXAPRO) 10 MG tablet   Other Relevant Orders   Ambulatory referral  to Psychology   HLD (hyperlipidemia) - Primary    Continue atorvastatin 40 mg once daily. Ordered lipid panel, pending results. Work on low cholesterol diet and exercise as tolerated       Relevant Medications   lisinopril-hydrochlorothiazide (ZESTORETIC) 20-12.5 MG tablet   metoprolol succinate (TOPROL-XL) 25 MG 24 hr tablet   Other Relevant Orders   Lipid panel   Hyperglycemia    Repeat a1c  Work on diabetic diet      Relevant Orders   Hemoglobin A1c   Irregular heart beat    ekg today in office        Relevant Medications   metoprolol succinate (TOPROL-XL) 25 MG 24 hr tablet   Other Relevant Orders   EKG 12-Lead (Completed)   Ambulatory referral to Cardiology    Meds ordered this encounter  Medications   DISCONTD: buPROPion (WELLBUTRIN XL) 150 MG 24 hr tablet    Sig: Take 1 tablet (150 mg total) by mouth daily.    Dispense:  90 tablet    Refill:  1   lisinopril-hydrochlorothiazide (ZESTORETIC) 20-12.5 MG tablet    Sig: Take 1 tablet by mouth daily.    Dispense:  90 tablet    Refill:  3    Order Specific Question:   Supervising Provider    Answer:   BEDSOLE, AMY E [[id number]]   metoprolol succinate (TOPROL-XL) 25 MG 24 hr tablet    Sig: Take 1 tablet (25 mg total) by mouth daily.    Dispense:  30 tablet    Refill:  2    Order Specific Question:   Supervising Provider    Answer:   BEDSOLE, AMY E [[id number]]   escitalopram (LEXAPRO) 10 MG tablet    Sig: Take 1 tablet (10 mg total) by mouth daily.    Dispense:  90 tablet    Refill:  0    Order Specific Question:   Supervising Provider    Answer:   Diona Browner, AMY E [[id number]]    Follow-up: Return in about 1 month (around 05/25/2022) for f/u blood pressure.    Eugenia Pancoast, FNP

## 2022-04-26 ENCOUNTER — Other Ambulatory Visit: Payer: Self-pay | Admitting: Family

## 2022-04-26 DIAGNOSIS — R7989 Other specified abnormal findings of blood chemistry: Secondary | ICD-10-CM

## 2022-04-26 DIAGNOSIS — E782 Mixed hyperlipidemia: Secondary | ICD-10-CM

## 2022-04-26 MED ORDER — ATORVASTATIN CALCIUM 80 MG PO TABS: 80.0000 mg | ORAL_TABLET | Freq: Every day | ORAL | 3 refills | Status: DC

## 2022-05-11 ENCOUNTER — Other Ambulatory Visit: Payer: Self-pay | Admitting: Family

## 2022-05-11 ENCOUNTER — Ambulatory Visit
Admission: RE | Admit: 2022-05-11 | Discharge: 2022-05-11 | Disposition: A | Discharge status: Home / Self Care | Payer: BC Managed Care – PPO | Source: Ambulatory Visit | Attending: Family | Admitting: Family

## 2022-05-11 ENCOUNTER — Telehealth: Payer: Self-pay

## 2022-05-11 DIAGNOSIS — R7989 Other specified abnormal findings of blood chemistry: Secondary | ICD-10-CM

## 2022-05-11 NOTE — Telephone Encounter (Signed)
Malachy Mood with [facility] radiology called report on [place] abd limited. Report is in Epic and copy of report taken to Red Christians FNP area. Sending note to T Dugal FNP and Claiborne Billings CMA.

## 2022-05-11 NOTE — Telephone Encounter (Signed)
Has been reviewed and addressed.

## 2022-05-11 NOTE — Progress Notes (Signed)
Advise pt there was a mass seen on her left liver.  We need a better visual with an MRI. She did have a small mass on prior CT per radiologist however they want to ensure this is the same mass. Has pt ever had any metal or rods in body?

## 2022-05-14 ENCOUNTER — Telehealth: Payer: Self-pay | Admitting: Family

## 2022-05-14 NOTE — Telephone Encounter (Signed)
Patient called in returning a call she received.  

## 2022-05-14 NOTE — Telephone Encounter (Signed)
Called and spoke to pt

## 2022-05-16 ENCOUNTER — Other Ambulatory Visit: Payer: Self-pay | Admitting: Family

## 2022-05-16 ENCOUNTER — Encounter: Payer: Self-pay | Admitting: *Deleted

## 2022-05-16 DIAGNOSIS — K769 Liver disease, unspecified: Secondary | ICD-10-CM

## 2022-05-16 DIAGNOSIS — R160 Hepatomegaly, not elsewhere classified: Secondary | ICD-10-CM

## 2022-05-16 NOTE — Progress Notes (Unsigned)
Advise pt I have ordered MRI liver. Pending auth from insurance. If she doesn't hear anything in the next one week let me know.

## 2022-05-18 NOTE — Progress Notes (Signed)
Pt informed

## 2022-05-22 ENCOUNTER — Ambulatory Visit
Admission: RE | Admit: 2022-05-22 | Discharge: 2022-05-22 | Disposition: A | Discharge status: Home / Self Care | Payer: BC Managed Care – PPO | Source: Ambulatory Visit | Attending: Family | Admitting: Family

## 2022-05-22 DIAGNOSIS — K769 Liver disease, unspecified: Secondary | ICD-10-CM | POA: Insufficient documentation

## 2022-05-22 MED ORDER — GADOBUTROL 1 MMOL/ML IV SOLN
8.0000 mL | Freq: Once | INTRAVENOUS | Status: AC | PRN
  Administered 2022-05-22: 8 mL via INTRAVENOUS

## 2022-05-23 ENCOUNTER — Encounter: Payer: Self-pay | Admitting: Family

## 2022-05-23 DIAGNOSIS — R748 Abnormal levels of other serum enzymes: Secondary | ICD-10-CM

## 2022-05-23 DIAGNOSIS — R160 Hepatomegaly, not elsewhere classified: Secondary | ICD-10-CM

## 2022-05-23 DIAGNOSIS — K769 Liver disease, unspecified: Secondary | ICD-10-CM

## 2022-05-23 DIAGNOSIS — K760 Fatty (change of) liver, not elsewhere classified: Secondary | ICD-10-CM

## 2022-06-12 ENCOUNTER — Other Ambulatory Visit
Admission: RE | Admit: 2022-06-12 | Discharge: 2022-06-12 | Disposition: A | Discharge status: Home / Self Care | Payer: BC Managed Care – PPO | Attending: Cardiology | Admitting: Cardiology

## 2022-06-12 ENCOUNTER — Ambulatory Visit: Payer: BC Managed Care – PPO | Attending: Cardiology | Admitting: Cardiology

## 2022-06-12 ENCOUNTER — Encounter: Payer: Self-pay | Admitting: Cardiology

## 2022-06-12 VITALS — BP 138/80 | HR 69 | Ht 67.5 in | Wt 191.4 lb

## 2022-06-12 DIAGNOSIS — I2089 Other forms of angina pectoris: Secondary | ICD-10-CM

## 2022-06-12 DIAGNOSIS — R0609 Other forms of dyspnea: Secondary | ICD-10-CM | POA: Diagnosis present

## 2022-06-12 DIAGNOSIS — F17200 Nicotine dependence, unspecified, uncomplicated: Secondary | ICD-10-CM

## 2022-06-12 DIAGNOSIS — I499 Cardiac arrhythmia, unspecified: Secondary | ICD-10-CM | POA: Diagnosis not present

## 2022-06-12 DIAGNOSIS — E782 Mixed hyperlipidemia: Secondary | ICD-10-CM

## 2022-06-12 DIAGNOSIS — I10 Essential (primary) hypertension: Secondary | ICD-10-CM

## 2022-06-12 DIAGNOSIS — IMO0001 Reserved for inherently not codable concepts without codable children: Secondary | ICD-10-CM

## 2022-06-12 LAB — BASIC METABOLIC PANEL
Anion gap: 8 (ref 5–15)
BUN: 13 mg/dL (ref 6–20)
CO2: 31 mmol/L (ref 22–32)
Calcium: 9.8 mg/dL (ref 8.9–10.3)
Chloride: 103 mmol/L (ref 98–111)
Creatinine, Ser: 0.62 mg/dL (ref 0.44–1.00)
GFR, Estimated: >60 mL/min (ref >60)
Glucose, Bld: 105 mg/dL — ABNORMAL HIGH (ref 70–99)
Potassium: 3.4 mmol/L — ABNORMAL LOW (ref 3.5–5.1)
Sodium: 142 mmol/L (ref 135–145)

## 2022-06-12 MED ORDER — METOPROLOL TARTRATE 100 MG PO TABS: 100.0000 mg | ORAL_TABLET | Freq: Once | ORAL | 0 refills | Status: DC

## 2022-06-12 MED ORDER — IVABRADINE HCL 5 MG PO TABS: 10.0000 mg | ORAL_TABLET | Freq: Once | ORAL | 0 refills | Status: AC

## 2022-06-12 NOTE — Progress Notes (Signed)
Cardiology Office Note:    Date:  06/12/2022   ID:  Beth Green, DOB 09/11/1965, MRN [id number]  PCP:  Eugenia Pancoast, [place] Prov[facility] Cardiologist:  Kate Sable, MD     Referring MD: Eugenia Pancoast, FNP   Chief Complaint  Patient presents with   New Patient (Initial Visit)    Irregular heart beat, PAC, Cardiac Hx, some chest pain    Beth Green is a 56 y.o. female who is being seen today for the evaluation of irregular heartbeat at the request of Eugenia Pancoast, [place].   History of Present Illness:    Beth Green is a 56 y.o. female with a hx of Hypertension, hyperlipidemia, Current smoker x30+ years who presents due to irregular heartbeat and dyspnea.   Saw primary care provider for scheduled visit, heart Rhythm was noted to be irregular. EKG obtained at PCPs office showed PACs .  She also endorses chest pain, not related with exertion usually present on the right side.  Has shortness of breath with exertion ongoing over the past several weeks.  Denies any history of heart disease.    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, excised 04/30/2018   Vulvar intraepithelial neoplasia (VIN) grade 3     Past Surgical History:  Procedure Laterality Date   INCISE AND DRAIN ABCESS  08/2016   spider bite (right index finger)   INCISION AND DRAINAGE ABSCESS N/A 04/28/2017   Procedure: INCISION AND DRAINAGE ABSCESS of Vulva;  Surgeon: Jonnie Kind, MD;  Location: [facility];  Service: Gynecology;  Laterality: N/A;   INCISION AND DRAINAGE ABSCESS N/A 04/30/2017   Procedure: INCISION AND DRAINAGE of Vulvar ABSCESS;  Surgeon: Jonnie Kind, MD;  Location: [facility];  Service: Gynecology;   Laterality: N/A;   LAPAROSCOPIC ASSISTED VAGINAL HYSTERECTOMY  2007   LESION REMOVAL N/A 04/18/2017   Procedure: EXCISION VAGINAL LESION - Vulvar Mass;  Surgeon: Donnamae Jude, MD;  Location: [facility];  Service: Gynecology;  Laterality: N/A;    Current Medications: Current Meds  Medication Sig   atorvastatin (LIPITOR) 80 MG tablet Take 1 tablet (80 mg total) by mouth daily.   diphenhydrAMINE (BENADRYL) 50 MG capsule Take 50 mg by mouth as needed for sleep.   escitalopram (LEXAPRO) 10 MG tablet Take 1 tablet (10 mg total) by mouth daily.   ivabradine (CORLANOR) 5 MG TABS tablet Take 2 tablets (10 mg total) by mouth once for 1 dose. Take 2 hours prior to your CT scan.   lisinopril-hydrochlorothiazide (ZESTORETIC) 20-12.5 MG tablet Take 1 tablet by mouth daily.   metoprolol succinate (TOPROL-XL) 25 MG 24 hr tablet Take 1 tablet (25 mg total) by mouth daily.   metoprolol tartrate (LOPRESSOR) 100 MG tablet Take 1 tablet (100 mg total) by mouth once for 1 dose. Take 2 hours prior to your CT scan.   Multiple Vitamins-Minerals (CENTRUM ADULTS PO)    Omega-3 Fatty Acids (FISH OIL) 1200 MG CAPS    pantoprazole (PROTONIX) 40 MG tablet TAKE 1 TABLET BY MOUTH DAILY WITH BREAKFAST.   Probiotic Product (MISC INTESTINAL FLORA REGULAT) CAPS Take 1 capsule by  mouth daily.   Probiotic Product (PRO-BIOTIC BLEND PO)      Allergies:   Patient has no known allergies.   Social History   Socioeconomic History   Marital status: Married    Spouse name: Not on file   Number of children: Not on file   Years of education: Not on file   Highest education level: Not on file  Occupational History   Occupation: Secretary/administrator  Tobacco Use   Smoking status: Every Day    Packs/day: 0.25    Years: 25.00    Total pack years: 6.25    Types: Cigarettes   Smokeless tobacco: Never   Tobacco comments:    D/w her about this, she states she occasionally considers quitting, has decreased where she smokes  and how frequently.   Vaping Use   Vaping Use: Never used  Substance and Sexual Activity   Alcohol use: Yes    Alcohol/week: 15.0 standard drinks of alcohol    Types: 10 Glasses of wine, 5 Shots of liquor per week    Comment: wine/bourbon 5 days a week   Drug use: No   Sexual activity: Yes    Partners: Male    Birth control/protection: Surgical    Comment: Hysterectomy  Other Topics Concern   Not on file  Social History Narrative   Not on file   Social Determinants of Health   Financial Resource Strain: Not on file  Food Insecurity: Not on file  Transportation Needs: Not on file  Physical Activity: Not on file  Stress: Not on file  Social Connections: Not on file     Family History: The patient's family history includes Diabetes in her father; Hypertension in her paternal grandfather; Kidney disease in her father. There is no history of Colon cancer, Stomach cancer, or Esophageal cancer.  ROS:   Please see the history of present illness.     All other systems reviewed and are negative.  EKGs/Labs/Other Studies Reviewed:    The following studies were reviewed today:   EKG:  EKG is  ordered today.  The ekg ordered today demonstrates sinus rhythm, PACs.  Recent Labs: 11/23/2021: Hemoglobin 13.7; Platelets 157.0 04/25/2022: ALT 51; BUN 12; Creatinine, Ser 0.66; Potassium 3.5; Sodium 142  Recent Lipid Panel    Component Value Date/Time   CHOL 154 04/25/2022 0946   CHOL 258 (H) 02/15/2017 1158   TRIG 331.0 (H) 04/25/2022 0946   HDL 47.80 04/25/2022 0946   HDL 53 02/15/2017 1158   CHOLHDL 3 04/25/2022 0946   VLDL 66.2 (H) 04/25/2022 0946   LDLCALC 141 (H) 02/15/2017 1158   LDLDIRECT 78.0 04/25/2022 0946     Risk Assessment/Calculations:        Physical Exam:    VS:  BP 138/80 (BP Location: Right Arm)   Pulse 69   Ht 5' 7.5" (1.715 m)   Wt 191 lb 6.4 oz (86.8 kg)   SpO2 98%   BMI 29.54 kg/m     Wt Readings from Last 3 Encounters:  06/12/22 191 lb 6.4 oz  (86.8 kg)  04/25/22 194 lb (88 kg)  08/31/21 195 lb (88.5 kg)     GEN:  Well nourished, well developed in no acute distress HEENT: Normal NECK: No JVD; No carotid bruits CARDIAC: Irregular heartbeat, no murmurs RESPIRATORY:  Clear to auscultation without rales, wheezing or rhonchi  ABDOMEN: Soft, non-tender, non-distended MUSCULOSKELETAL:  No edema; No deformity  SKIN: Warm and dry NEUROLOGIC:  Alert and oriented x 3  PSYCHIATRIC:  Normal affect   ASSESSMENT:    1. Dyspnea on exertion   2. Primary hypertension   3. Mixed hyperlipidemia   4. Smoking   5. Irregular heart beat   6. Anginal equivalent    PLAN:    In order of problems listed above:  Dyspnea on exertion, this could be an anginal equivalent, location of chest pain.  Get echo, get coronary CTA to evaluate CAD. Hypertension, BP reasonable.  Continue lisinopril 20, HCTZ 12.5, Toprol-XL. Hyperlipidemia, continue high intensity statin 80 mg daily. Current smoker, smoking cessation advised Irregular heartbeat, due to frequent PACs.  Smoking likely contributing.  Follow-up after echo and coronary CTA.      Medication Adjustments/Labs and Tests Ordered: Current medicines are reviewed at length with the patient today.  Concerns regarding medicines are outlined above.  Orders Placed This Encounter  Procedures   CT CORONARY MORPH W/CTA COR W/SCORE W/CA W/CM &/OR WO/CM   Basic metabolic panel   EKG 36-RWER   ECHOCARDIOGRAM COMPLETE   Meds ordered this encounter  Medications   metoprolol tartrate (LOPRESSOR) 100 MG tablet    Sig: Take 1 tablet (100 mg total) by mouth once for 1 dose. Take 2 hours prior to your CT scan.    Dispense:  1 tablet    Refill:  0   ivabradine (CORLANOR) 5 MG TABS tablet    Sig: Take 2 tablets (10 mg total) by mouth once for 1 dose. Take 2 hours prior to your CT scan.    Dispense:  2 tablet    Refill:  0    Patient Instructions  Medication Instructions:   Your physician recommends  that you continue on your current medications as directed. Please refer to the Current Medication list given to you today.   *If you need a refill on your cardiac medications before your next appointment, please call your pharmacy*   Lab Work:  Please go to the [facility] after your appointment today for a BMP Lab draw.   Testing/Procedures:  Your physician has requested that you have an echocardiogram. Echocardiography is a painless test that uses sound waves to create images of your heart. It provides your doctor with information about the size and shape of your heart and how well your heart's chambers and valves are working. This procedure takes approximately one hour. There are no restrictions for this procedure. Please do NOT wear cologne, perfume, aftershave, or lotions (deodorant is allowed). Please arrive 15 minutes prior to your appointment time.   2.   Your physician has requested that you have cardiac CT. Cardiac computed tomography (CT) is a painless test that uses an x-ray machine to take clear, detailed pictures of your heart.    Your cardiac CT will be scheduled at:   [facility] [place], [place] [address] [phone]  Monday 06/25/22 at 8:00 AM  Please arrive 15 mins early for check-in and test prep.    Please follow these instructions carefully (unless otherwise directed):    Night Before the Test: Be sure to Drink plenty of water. Do not consume any caffeinated/decaffeinated beverages or chocolate 12 hours prior to your test.    On the Day of the Test: Drink plenty of water until 1 hour prior to the test. Do not eat any food 4 hours prior to the test. You may take your regular medications prior to the test.  Take metoprolol (Lopressor) two hours prior to test. Take Ivabradine (Corlanor)  10 MG two hours prior to test. FEMALES- please wear underwire-free bra if available, avoid dresses & tight clothing         After the Test: Drink plenty of water. After receiving IV contrast, you may experience a mild flushed feeling. This is normal. On occasion, you may experience a mild rash up to 24 hours after the test. This is not dangerous. If this occurs, you can take Benadryl 25 mg and increase your fluid intake. If you experience trouble breathing, this can be serious. If it is severe call 911 IMMEDIATELY. If it is mild, please call our office. If you take any of these medications: Glipizide/Metformin, Avandament, Glucavance, please do not take 48 hours after completing test unless otherwise instructed.  Please allow 2-4 weeks for scheduling of routine cardiac CTs. Some insurance companies require a pre-authorization which may delay scheduling of this test.   For non-scheduling related questions, please contact the cardiac imaging nurse navigator should you have any questions/concerns: Marchia Bond, Cardiac Imaging Nurse Navigator Gordy Clement, Cardiac Imaging Nurse Navigator [facility] Direct Office Dial: [phone]   For scheduling needs, including cancellations and rescheduling, please call [place], [phone].     Follow-Up: At [facility], you and your health needs are our priority.  As part of our continuing mission to provide you with exceptional heart care, we have created designated Provider Care Teams.  These Care Teams include your primary Cardiologist (physician) and Advanced Practice Providers (APPs -  Physician Assistants and Nurse Practitioners) who all work together to provide you with the care you need, when you need it.  We recommend signing up for the patient portal called "MyChart".  Sign up information is provided on this After Visit Summary.  MyChart is used to connect with patients for Virtual Visits (Telemedicine).  Patients are able to view lab/test results, encounter notes, upcoming appointments, etc.  Non-urgent messages can be  sent to your provider as well.   To learn more about what you can do with MyChart, go to [email or web address].    Your next appointment:   Follow up after testing   The format for your next appointment:   In Person  Provider:   You may see Kate Sable, MD or one of the following Advanced Practice Providers on your designated Care Team:   Murray Hodgkins, NP Christell Faith, PA-C Cadence Kathlen Mody, PA-C Gerrie Nordmann, NP    Other Instructions   Important Information About Sugar         Signed, Kate Sable, MD  06/12/2022 9:47 AM    [place]

## 2022-06-12 NOTE — Patient Instructions (Addendum)
Medication Instructions:   Your physician recommends that you continue on your current medications as directed. Please refer to the Current Medication list given to you today.   *If you need a refill on your cardiac medications before your next appointment, please call your pharmacy*   Lab Work:  Please go to the [facility] after your appointment today for a BMP Lab draw.   Testing/Procedures:  Your physician has requested that you have an echocardiogram. Echocardiography is a painless test that uses sound waves to create images of your heart. It provides your doctor with information about the size and shape of your heart and how well your heart's chambers and valves are working. This procedure takes approximately one hour. There are no restrictions for this procedure. Please do NOT wear cologne, perfume, aftershave, or lotions (deodorant is allowed). Please arrive 15 minutes prior to your appointment time.   2.   Your physician has requested that you have cardiac CT. Cardiac computed tomography (CT) is a painless test that uses an x-ray machine to take clear, detailed pictures of your heart.    Your cardiac CT will be scheduled at:   [facility] [place], [place] [address] [phone]  Monday 06/25/22 at 8:00 AM  Please arrive 15 mins early for check-in and test prep.    Please follow these instructions carefully (unless otherwise directed):    Night Before the Test: Be sure to Drink plenty of water. Do not consume any caffeinated/decaffeinated beverages or chocolate 12 hours prior to your test.    On the Day of the Test: Drink plenty of water until 1 hour prior to the test. Do not eat any food 4 hours prior to the test. You may take your regular medications prior to the test.  Take metoprolol (Lopressor) two hours prior to test. Take Ivabradine (Corlanor) 10 MG two hours prior to test. FEMALES- please wear underwire-free  bra if available, avoid dresses & tight clothing        After the Test: Drink plenty of water. After receiving IV contrast, you may experience a mild flushed feeling. This is normal. On occasion, you may experience a mild rash up to 24 hours after the test. This is not dangerous. If this occurs, you can take Benadryl 25 mg and increase your fluid intake. If you experience trouble breathing, this can be serious. If it is severe call 911 IMMEDIATELY. If it is mild, please call our office. If you take any of these medications: Glipizide/Metformin, Avandament, Glucavance, please do not take 48 hours after completing test unless otherwise instructed.  Please allow 2-4 weeks for scheduling of routine cardiac CTs. Some insurance companies require a pre-authorization which may delay scheduling of this test.   For non-scheduling related questions, please contact the cardiac imaging nurse navigator should you have any questions/concerns: Marchia Bond, Cardiac Imaging Nurse Navigator Gordy Clement, Cardiac Imaging Nurse Navigator [facility] Direct Office Dial: [phone]   For scheduling needs, including cancellations and rescheduling, please call [place], [phone].     Follow-Up: At [facility], you and your health needs are our priority.  As part of our continuing mission to provide you with exceptional heart care, we have created designated Provider Care Teams.  These Care Teams include your primary Cardiologist (physician) and Advanced Practice Providers (APPs -  Physician Assistants and Nurse Practitioners) who all work together to provide you with the care you need, when you need it.  We recommend signing up for the patient portal called "MyChart".  Sign up information is provided on this After Visit Summary.  MyChart is used to connect with patients for Virtual Visits (Telemedicine).  Patients are able to view lab/test results, encounter notes,  upcoming appointments, etc.  Non-urgent messages can be sent to your provider as well.   To learn more about what you can do with MyChart, go to [email or web address].    Your next appointment:   Follow up after testing   The format for your next appointment:   In Person  Provider:   You may see Kate Sable, MD or one of the following Advanced Practice Providers on your designated Care Team:   Murray Hodgkins, NP Christell Faith, PA-C Cadence Kathlen Mody, PA-C Gerrie Nordmann, NP    Other Instructions   Important Information About Sugar

## 2022-06-21 ENCOUNTER — Encounter: Payer: Self-pay | Admitting: Cardiology

## 2022-07-04 ENCOUNTER — Telehealth (HOSPITAL_COMMUNITY): Payer: Self-pay | Admitting: Emergency Medicine

## 2022-07-04 NOTE — Telephone Encounter (Signed)
Reaching out to patient to offer assistance regarding upcoming cardiac imaging study; pt verbalizes understanding of appt date/time, parking situation and where to check in, pre-test NPO status and medications ordered, and verified current allergies; name and call back number provided for further questions should they arise Marchia Bond RN Navigator Cardiac Imaging [facility] [phone] office [phone] cell   Arrival 745 Denies iv issues '100mg'$  metoprolol + '10mg'$  ivabradine Holding zestoretic , metop succ

## 2022-07-05 ENCOUNTER — Ambulatory Visit
Admission: RE | Admit: 2022-07-05 | Discharge: 2022-07-05 | Disposition: A | Discharge status: Home / Self Care | Payer: BC Managed Care – PPO | Source: Ambulatory Visit | Attending: Cardiology | Admitting: Cardiology

## 2022-07-05 DIAGNOSIS — I2089 Other forms of angina pectoris: Secondary | ICD-10-CM

## 2022-07-05 MED ORDER — NITROGLYCERIN 0.4 MG SL SUBL
0.8000 mg | SUBLINGUAL_TABLET | Freq: Once | SUBLINGUAL | Status: AC
  Administered 2022-07-05: 0.8 mg via SUBLINGUAL

## 2022-07-05 MED ORDER — IOHEXOL 350 MG/ML SOLN
75.0000 mL | Freq: Once | INTRAVENOUS | Status: AC | PRN
  Administered 2022-07-05: 75 mL via INTRAVENOUS

## 2022-07-05 NOTE — Progress Notes (Signed)
Patient tolerated procedure well. Ambulate w/o difficulty. Denies light headedness or being dizzy. Sitting in chair drinking water provided. Encouraged to drink extra water today and reasoning explained. Verbalized understanding. All questions answered. ABC intact. No further needs. Discharge from procedure area w/o issues.   °

## 2022-07-06 ENCOUNTER — Telehealth: Payer: Self-pay

## 2022-07-06 MED ORDER — ASPIRIN 81 MG PO TBEC: 81.0000 mg | DELAYED_RELEASE_TABLET | Freq: Every day | ORAL | 3 refills | Status: AC

## 2022-07-06 NOTE — Telephone Encounter (Signed)
The patient has been notified of the result via VM per DPR on file.  Encouraged patient to call back with any questions or concerns.  

## 2022-07-06 NOTE — Telephone Encounter (Signed)
-----   Message from Kate Sable, MD sent at 07/06/2022 12:47 PM EST ----- Coronary CTA showed minimal nonobstructive disease.  Start aspirin 81 mg, continue medications as prescribed, smoking cessation recommended.

## 2022-07-18 ENCOUNTER — Other Ambulatory Visit: Payer: Self-pay | Admitting: Family

## 2022-07-18 DIAGNOSIS — F411 Generalized anxiety disorder: Secondary | ICD-10-CM

## 2022-07-20 NOTE — Telephone Encounter (Signed)
Patient notified as instructed by telephone. Patient scheduled an appointment with Eugenia Pancoast NP 08/13/21 at 8:00 am

## 2022-07-24 ENCOUNTER — Other Ambulatory Visit: Payer: Self-pay | Admitting: Family

## 2022-07-24 DIAGNOSIS — I491 Atrial premature depolarization: Secondary | ICD-10-CM

## 2022-07-24 DIAGNOSIS — K219 Gastro-esophageal reflux disease without esophagitis: Secondary | ICD-10-CM

## 2022-07-24 DIAGNOSIS — I499 Cardiac arrhythmia, unspecified: Secondary | ICD-10-CM

## 2022-08-03 ENCOUNTER — Ambulatory Visit: Payer: BC Managed Care – PPO | Attending: Cardiology

## 2022-08-03 DIAGNOSIS — I2089 Other forms of angina pectoris: Secondary | ICD-10-CM | POA: Diagnosis not present

## 2022-08-03 LAB — ECHOCARDIOGRAM COMPLETE
AR max vel: 2.14 cm2
AV Area VTI: 2.14 cm2
AV Area mean vel: 2.06 cm2
AV Mean grad: 4 mmHg
AV Peak grad: 7.7 mmHg
Ao pk vel: 1.39 m/s
Area-P 1/2: 3.99 cm2
Calc EF: 56.8 %
S' Lateral: 2.9 cm
Single Plane A2C EF: 57.6 %
Single Plane A4C EF: 53.9 %

## 2022-08-10 ENCOUNTER — Ambulatory Visit: Payer: BC Managed Care – PPO | Attending: Cardiology | Admitting: Cardiology

## 2022-08-10 ENCOUNTER — Encounter: Payer: Self-pay | Admitting: Cardiology

## 2022-08-10 VITALS — BP 120/80 | HR 82 | Ht 67.5 in | Wt 197.1 lb

## 2022-08-10 DIAGNOSIS — IMO0001 Reserved for inherently not codable concepts without codable children: Secondary | ICD-10-CM

## 2022-08-10 DIAGNOSIS — I10 Essential (primary) hypertension: Secondary | ICD-10-CM

## 2022-08-10 DIAGNOSIS — I2510 Atherosclerotic heart disease of native coronary artery without angina pectoris: Secondary | ICD-10-CM | POA: Diagnosis not present

## 2022-08-10 DIAGNOSIS — E782 Mixed hyperlipidemia: Secondary | ICD-10-CM | POA: Diagnosis not present

## 2022-08-10 DIAGNOSIS — I2584 Coronary atherosclerosis due to calcified coronary lesion: Secondary | ICD-10-CM

## 2022-08-10 DIAGNOSIS — F17200 Nicotine dependence, unspecified, uncomplicated: Secondary | ICD-10-CM | POA: Diagnosis not present

## 2022-08-10 NOTE — Progress Notes (Signed)
Cardiology Office Note:    Date:  08/10/2022   ID:  Beth Green, DOB 1965/10/14, MRN [id number]  PCP:  Eugenia Pancoast, [facility] Cardiologist:  Kate Sable, MD     Referring MD: Eugenia Pancoast, FNP   Chief Complaint  Patient presents with   Other    Fu echo/CT no complaints today. Meds reviewed verbally with pt.     History of Present Illness:    Beth Green is a 57 y.o. female with a hx of Hypertension, hyperlipidemia, Current smoker x30+ years who presents for follow-up.  Previously seen due to dyspnea.  Echo and coronary CTA obtained due to risk factors.  She still smokes, is working on quitting.  Feels well, has no new complaints or concerns at this time.   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, excised 04/30/2018   Vulvar intraepithelial neoplasia (VIN) grade 3     Past Surgical History:  Procedure Laterality Date   INCISE AND DRAIN ABCESS  08/2016   spider bite (right index finger)   INCISION AND DRAINAGE ABSCESS N/A 04/28/2017   Procedure: INCISION AND DRAINAGE ABSCESS of Vulva;  Surgeon: Jonnie Kind, MD;  Location: [facility];  Service: Gynecology;  Laterality: N/A;   INCISION AND DRAINAGE ABSCESS N/A 04/30/2017   Procedure: INCISION AND DRAINAGE of Vulvar ABSCESS;  Surgeon: Jonnie Kind, MD;  Location: [facility];  Service: Gynecology;  Laterality: N/A;   LAPAROSCOPIC ASSISTED VAGINAL HYSTERECTOMY  2007   LESION REMOVAL N/A 04/18/2017   Procedure: EXCISION VAGINAL LESION - Vulvar Mass;  Surgeon: Donnamae Jude, MD;  Location: [facility];  Service: Gynecology;  Laterality: N/A;    Current Medications: Current Meds  Medication Sig    aspirin EC 81 MG tablet Take 1 tablet (81 mg total) by mouth daily. Swallow whole.   atorvastatin (LIPITOR) 80 MG tablet Take 1 tablet (80 mg total) by mouth daily.   diphenhydrAMINE (BENADRYL) 50 MG capsule Take 50 mg by mouth as needed for sleep.   escitalopram (LEXAPRO) 10 MG tablet TAKE 1 TABLET BY MOUTH EVERY DAY   lisinopril-hydrochlorothiazide (ZESTORETIC) 20-12.5 MG tablet Take 1 tablet by mouth daily.   metoprolol succinate (TOPROL-XL) 25 MG 24 hr tablet TAKE 1 TABLET (25 MG TOTAL) BY MOUTH DAILY.   Multiple Vitamins-Minerals (CENTRUM ADULTS PO)    Omega-3 Fatty Acids (FISH OIL) 1200 MG CAPS    pantoprazole (PROTONIX) 40 MG tablet TAKE 1 TABLET BY MOUTH DAILY WITH BREAKFAST.   Probiotic Product (PRO-BIOTIC BLEND PO)      Allergies:   Patient has no known allergies.   Social History   Socioeconomic History   Marital status: Married    Spouse name: Not on file   Number of children: Not on file   Years of education: Not on file   Highest education level: Not on file  Occupational History   Occupation: Secretary/administrator  Tobacco Use   Smoking status: Every Day    Packs/day: 0.25    Years: 25.00    Total pack years: 6.25    Types: Cigarettes   Smokeless tobacco: Never  Tobacco comments:    D/w her about this, she states she occasionally considers quitting, has decreased where she smokes and how frequently.   Vaping Use   Vaping Use: Never used  Substance and Sexual Activity   Alcohol use: Yes    Alcohol/week: 15.0 standard drinks of alcohol    Types: 10 Glasses of wine, 5 Shots of liquor per week    Comment: wine/bourbon 5 days a week   Drug use: No   Sexual activity: Yes    Partners: Male    Birth control/protection: Surgical    Comment: Hysterectomy  Other Topics Concern   Not on file  Social History Narrative   Not on file   Social Determinants of Health   Financial Resource Strain: Not on file  Food Insecurity: Not on file  Transportation Needs: Not on file   Physical Activity: Not on file  Stress: Not on file  Social Connections: Not on file     Family History: The patient's family history includes Diabetes in her father; Hypertension in her paternal grandfather; Kidney disease in her father. There is no history of Colon cancer, Stomach cancer, or Esophageal cancer.  ROS:   Please see the history of present illness.     All other systems reviewed and are negative.  EKGs/Labs/Other Studies Reviewed:    The following studies were reviewed today:   EKG:  EKG not ordered today.  Recent Labs: 11/23/2021: Hemoglobin 13.7; Platelets 157.0 04/25/2022: ALT 51 06/12/2022: BUN 13; Creatinine, Ser 0.62; Potassium 3.4; Sodium 142  Recent Lipid Panel    Component Value Date/Time   CHOL 154 04/25/2022 0946   CHOL 258 (H) 02/15/2017 1158   TRIG 331.0 (H) 04/25/2022 0946   HDL 47.80 04/25/2022 0946   HDL 53 02/15/2017 1158   CHOLHDL 3 04/25/2022 0946   VLDL 66.2 (H) 04/25/2022 0946   LDLCALC 141 (H) 02/15/2017 1158   LDLDIRECT 78.0 04/25/2022 0946     Risk Assessment/Calculations:        Physical Exam:    VS:  BP 120/80 (BP Location: Left Arm, Patient Position: Sitting, Cuff Size: Normal)   Pulse 82   Ht 5' 7.5" (1.715 m)   Wt 197 lb 2 oz (89.4 kg)   SpO2 98%   BMI 30.42 kg/m     Wt Readings from Last 3 Encounters:  08/10/22 197 lb 2 oz (89.4 kg)  06/12/22 191 lb 6.4 oz (86.8 kg)  04/25/22 194 lb (88 kg)     GEN:  Well nourished, well developed in no acute distress HEENT: Normal NECK: No JVD; No carotid bruits CARDIAC: Irregular heartbeat, no murmurs RESPIRATORY:  Clear to auscultation without rales, wheezing or rhonchi  ABDOMEN: Soft, non-tender, non-distended MUSCULOSKELETAL:  No edema; No deformity  SKIN: Warm and dry NEUROLOGIC:  Alert and oriented x 3 PSYCHIATRIC:  Normal affect   ASSESSMENT:    1. Coronary artery calcification   2. Primary hypertension   3. Mixed hyperlipidemia   4. Smoking     PLAN:     In order of problems listed above:  Dyspnea on exertion, echo showed EF 55 to 58%, diastolic function normal.  Coronary CTA showed minimal nonobstructive CAD involving LAD, RCA, distal left main.  Start aspirin, continue Lipitor 80 mg daily.  LDL at goal. Hypertension, BP controlled..  Continue lisinopril 20, HCTZ 12.5, Toprol-XL. Hyperlipidemia, cholesterol controlled.  Continue high intensity statin 80 mg daily. Current smoker, smoking cessation again advised  Follow-up yearly.  Medication Adjustments/Labs and Tests Ordered: Current medicines are reviewed at length with the patient today.  Concerns regarding medicines are outlined above.  No orders of the defined types were placed in this encounter.  No orders of the defined types were placed in this encounter.   Patient Instructions  Medication Instructions:   None Ordered  *If you need a refill on your cardiac medications before your next appointment, please call your pharmacy*   Lab Work:  None Ordered  If you have labs (blood work) drawn today and your tests are completely normal, you will receive your results only by: [place] (if you have MyChart) OR A paper copy in the mail If you have any lab test that is abnormal or we need to change your treatment, we will call you to review the results.   Testing/Procedures:  None Ordered   Follow-Up: At [facility], you and your health needs are our priority.  As part of our continuing mission to provide you with exceptional heart care, we have created designated Provider Care Teams.  These Care Teams include your primary Cardiologist (physician) and Advanced Practice Providers (APPs -  Physician Assistants and Nurse Practitioners) who all work together to provide you with the care you need, when you need it.  We recommend signing up for the patient portal called "MyChart".  Sign up information is provided on this After Visit Summary.  MyChart is used  to connect with patients for Virtual Visits (Telemedicine).  Patients are able to view lab/test results, encounter notes, upcoming appointments, etc.  Non-urgent messages can be sent to your provider as well.   To learn more about what you can do with MyChart, go to [email or web address].    Your next appointment:   12 month(s)  The format for your next appointment:   In Person  Provider:   You may see Kate Sable, MD or one of the following Advanced Practice Providers on your designated Care Team:   Murray Hodgkins, NP Christell Faith, PA-C Cadence Kathlen Mody, PA-C Gerrie Nordmann, NP   Signed, Kate Sable, MD  08/10/2022 5:00 PM    [place]

## 2022-08-10 NOTE — Patient Instructions (Signed)
Medication Instructions:   None Ordered  *If you need a refill on your cardiac medications before your next appointment, please call your pharmacy*   Lab Work:  None Ordered  If you have labs (blood work) drawn today and your tests are completely normal, you will receive your results only by: [place] (if you have MyChart) OR A paper copy in the mail If you have any lab test that is abnormal or we need to change your treatment, we will call you to review the results.   Testing/Procedures:  None Ordered   Follow-Up: At [facility], you and your health needs are our priority.  As part of our continuing mission to provide you with exceptional heart care, we have created designated Provider Care Teams.  These Care Teams include your primary Cardiologist (physician) and Advanced Practice Providers (APPs -  Physician Assistants and Nurse Practitioners) who all work together to provide you with the care you need, when you need it.  We recommend signing up for the patient portal called "MyChart".  Sign up information is provided on this After Visit Summary.  MyChart is used to connect with patients for Virtual Visits (Telemedicine).  Patients are able to view lab/test results, encounter notes, upcoming appointments, etc.  Non-urgent messages can be sent to your provider as well.   To learn more about what you can do with MyChart, go to [email or web address].    Your next appointment:   12 month(s)  The format for your next appointment:   In Person  Provider:   You may see Kate Sable, MD or one of the following Advanced Practice Providers on your designated Care Team:   Murray Hodgkins, NP Christell Faith, PA-C Cadence Kathlen Mody, PA-C Gerrie Nordmann, NP

## 2022-08-12 ENCOUNTER — Other Ambulatory Visit: Payer: Self-pay | Admitting: Nurse Practitioner

## 2022-08-12 DIAGNOSIS — F411 Generalized anxiety disorder: Secondary | ICD-10-CM

## 2022-08-13 ENCOUNTER — Ambulatory Visit (INDEPENDENT_AMBULATORY_CARE_PROVIDER_SITE_OTHER): Payer: BC Managed Care – PPO | Admitting: Family

## 2022-08-13 ENCOUNTER — Encounter: Payer: Self-pay | Admitting: Family

## 2022-08-13 VITALS — BP 136/89 | HR 67 | Temp 97.3°F | Ht 67.5 in | Wt 194.0 lb

## 2022-08-13 DIAGNOSIS — I491 Atrial premature depolarization: Secondary | ICD-10-CM | POA: Diagnosis not present

## 2022-08-13 DIAGNOSIS — R160 Hepatomegaly, not elsewhere classified: Secondary | ICD-10-CM | POA: Diagnosis not present

## 2022-08-13 DIAGNOSIS — Z23 Encounter for immunization: Secondary | ICD-10-CM | POA: Diagnosis not present

## 2022-08-13 DIAGNOSIS — K760 Fatty (change of) liver, not elsewhere classified: Secondary | ICD-10-CM | POA: Diagnosis not present

## 2022-08-13 DIAGNOSIS — I10 Essential (primary) hypertension: Secondary | ICD-10-CM

## 2022-08-13 DIAGNOSIS — R7989 Other specified abnormal findings of blood chemistry: Secondary | ICD-10-CM

## 2022-08-13 DIAGNOSIS — E782 Mixed hyperlipidemia: Secondary | ICD-10-CM

## 2022-08-13 DIAGNOSIS — R739 Hyperglycemia, unspecified: Secondary | ICD-10-CM

## 2022-08-13 NOTE — Patient Instructions (Signed)
  Second shingles vaccination in two months, nurse only visit.  Schedule for the same day a lab only appointment,   I have created an order for lab work today during our visit.  Please return fasting at your lab appointment (meaning you can only drink black coffee and or water prior to your appointment). I will reach out to you in regards to the labs when I receive the results.   Regards,   Eugenia Pancoast FNP-C

## 2022-08-13 NOTE — Assessment & Plan Note (Signed)
Severe on recent imaging.  Pt to see GI as scheduled for consult 3/24. Due for repeat MRI imaging around 4/24 Also aware due for colonoscopy will d/w GI at consult as well.

## 2022-08-13 NOTE — Progress Notes (Signed)
Established Patient Office Visit  Subjective:  Patient ID: Beth Green, female    DOB: 07-Apr-1966  Age: 57 y.o. MRN: [id number]  CC:  Chief Complaint  Patient presents with   Follow-up    BP Would like to get the 1st shingles shot    HPI Beth Green is here today for follow up.   Hypertension: lisinopril hctz 20-12.5 mg once daily also on metoprolol 25 mg XL. Tolerating well. Pt not checking her blood pressure at home, today 138/90. She did take her blood pressure this am two hours ago. She was requested by her cardiologist to start 81 mg once daily. She will f/u with them annually.   PAC: seen by cardiologist, Dr. Garen Lah, last visit 08/10/22. Echo and Ct cardac morph completed. Echo with 55-60 % EF. Also advised to continue lipitor 80 mg once daily. Denies myalgias.   Shingles vaccination: wants to get first vaccination today.   Colonoscopy: has appt with Dr. Verita Lamb 3/6 and will d/w with her then. She is due for her colonoscopy , last one with 3 polyps and internal hemorrhoids. Advised for 3-5 year f/u repeat. No blood in stool. Liver function stable, does have fatty liver.   Severe fatty liver with liver lesions: whiskey or wine, only weekends. One day heavier than average about 4-8 glassess, other nights 3-4 glasses. There is a recommendation for a repeat MRI in six months, pt will see GI and likely will repeat via Dr. Marius Ditch however if not will contact me.   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, excised 04/30/2018   Vulvar intraepithelial neoplasia (VIN) grade 3     Past Surgical History:  Procedure Laterality Date   INCISE AND DRAIN ABCESS  08/2016   spider bite (right index finger)   INCISION  AND DRAINAGE ABSCESS N/A 04/28/2017   Procedure: INCISION AND DRAINAGE ABSCESS of Vulva;  Surgeon: Jonnie Kind, MD;  Location: [facility];  Service: Gynecology;  Laterality: N/A;   INCISION AND DRAINAGE ABSCESS N/A 04/30/2017   Procedure: INCISION AND DRAINAGE of Vulvar ABSCESS;  Surgeon: Jonnie Kind, MD;  Location: [facility];  Service: Gynecology;  Laterality: N/A;   LAPAROSCOPIC ASSISTED VAGINAL HYSTERECTOMY  2007   LESION REMOVAL N/A 04/18/2017   Procedure: EXCISION VAGINAL LESION - Vulvar Mass;  Surgeon: Donnamae Jude, MD;  Location: [facility];  Service: Gynecology;  Laterality: N/A;    Family History  Problem Relation Age of Onset   Diabetes Father    Kidney disease Father    Hypertension Paternal Grandfather    Colon cancer Neg Hx    Stomach cancer Neg Hx    Esophageal cancer Neg Hx     Social History   Socioeconomic History   Marital status: Married    Spouse name: Not on file   Number of children: Not on file   Years of education: Not on file   Highest education level: Not on file  Occupational History   Occupation: Secretary/administrator  Tobacco Use   Smoking status: Every Day    Packs/day: 0.25    Years: 25.00    Total pack  years: 6.25    Types: Cigarettes   Smokeless tobacco: Never   Tobacco comments:    D/w her about this, she states she occasionally considers quitting, has decreased where she smokes and how frequently.   Vaping Use   Vaping Use: Never used  Substance and Sexual Activity   Alcohol use: Yes    Alcohol/week: 15.0 standard drinks of alcohol    Types: 10 Glasses of wine, 5 Shots of liquor per week    Comment: wine/bourbon 5 days a week   Drug use: No   Sexual activity: Yes    Partners: Male    Birth control/protection: Surgical    Comment: Hysterectomy  Other Topics Concern   Not on file  Social History Narrative   Not on file   Social Determinants of Health   Financial Resource Strain: Not on file  Food Insecurity: Not on  file  Transportation Needs: Not on file  Physical Activity: Not on file  Stress: Not on file  Social Connections: Not on file  Intimate Partner Violence: Not on file    Outpatient Medications Prior to Visit  Medication Sig Dispense Refill   aspirin EC 81 MG tablet Take 1 tablet (81 mg total) by mouth daily. Swallow whole. 90 tablet 3   atorvastatin (LIPITOR) 80 MG tablet Take 1 tablet (80 mg total) by mouth daily. 90 tablet 3   diphenhydrAMINE (BENADRYL) 50 MG capsule Take 50 mg by mouth as needed for sleep.     escitalopram (LEXAPRO) 10 MG tablet TAKE 1 TABLET BY MOUTH EVERY DAY 30 tablet 0   lisinopril-hydrochlorothiazide (ZESTORETIC) 20-12.5 MG tablet Take 1 tablet by mouth daily. 90 tablet 3   metoprolol succinate (TOPROL-XL) 25 MG 24 hr tablet TAKE 1 TABLET (25 MG TOTAL) BY MOUTH DAILY. 90 tablet 0   Multiple Vitamins-Minerals (CENTRUM ADULTS PO)      Omega-3 Fatty Acids (FISH OIL) 1200 MG CAPS      pantoprazole (PROTONIX) 40 MG tablet TAKE 1 TABLET BY MOUTH DAILY WITH BREAKFAST. 90 tablet 1   Probiotic Product (PRO-BIOTIC BLEND PO)      No facility-administered medications prior to visit.    No Known Allergies       Objective:    Physical Exam Constitutional:      Appearance: Normal appearance. She is obese.  Cardiovascular:     Rate and Rhythm: Normal rate. Rhythm irregular. Extrasystoles (pacs) are present. Pulmonary:     Effort: Pulmonary effort is normal.     Breath sounds: Normal breath sounds.  Neurological:     General: No focal deficit present.     Mental Status: She is alert and oriented to person, place, and time. Mental status is at baseline.  Psychiatric:        Mood and Affect: Mood normal.        Behavior: Behavior normal.        Thought Content: Thought content normal.        Judgment: Judgment normal.     BP 136/89   Pulse 67   Temp (!) 97.3 F (36.3 C) (Temporal)   Ht 5' 7.5" (1.715 m)   Wt 194 lb (88 kg)   SpO2 98%   BMI 29.94 kg/m   Wt Readings from Last 3 Encounters:  08/13/22 194 lb (88 kg)  08/10/22 197 lb 2 oz (89.4 kg)  06/12/22 191 lb 6.4 oz (86.8 kg)     Health Maintenance Due  Topic Date Due   Zoster Vaccines-  Shingrix (1 of 2) Never done   COLONOSCOPY (Pts 45-42yr Insurance coverage will need to be confirmed)  08/12/2022    There are no preventive care reminders to display for this patient.  Lab Results  Component Value Date   TSH 1.26 04/28/2019   Lab Results  Component Value Date   WBC 6.9 11/23/2021   HGB 13.7 11/23/2021   HCT 40.6 11/23/2021   MCV 88.7 11/23/2021   PLT 157.0 11/23/2021   Lab Results  Component Value Date   NA 142 06/12/2022   K 3.4 (L) 06/12/2022   CO2 31 06/12/2022   GLUCOSE 105 (H) 06/12/2022   BUN 13 06/12/2022   CREATININE 0.62 06/12/2022   BILITOT 0.6 04/25/2022   ALKPHOS 66 04/25/2022   AST 33 04/25/2022   ALT 51 (H) 04/25/2022   PROT 7.0 04/25/2022   ALBUMIN 4.5 04/25/2022   CALCIUM 9.8 06/12/2022   ANIONGAP 8 06/12/2022   GFR 97.97 04/25/2022   Lab Results  Component Value Date   CHOL 154 04/25/2022   Lab Results  Component Value Date   HDL 47.80 04/25/2022   Lab Results  Component Value Date   LDLCALC 141 (H) 02/15/2017   Lab Results  Component Value Date   TRIG 331.0 (H) 04/25/2022   Lab Results  Component Value Date   CHOLHDL 3 04/25/2022   Lab Results  Component Value Date   HGBA1C 6.2 04/25/2022      Assessment & Plan:   Problem List Items Addressed This Visit       Cardiovascular and Mediastinum   Benign essential hypertension - Primary    Continue metoprolol 25 mg Xl and lisinopril hctz  Continue f/u prn cardiologist.  Limit/stay away from caffeine for frequent pac's       PAC (premature atrial contraction)    Limit caffeine. Continue metoprolol XL 25 mg       No orders of the defined types were placed in this encounter.   Follow-up: Return in about 6 months (around 02/11/2023) for f/u blood pressure.     TEugenia Pancoast FNP

## 2022-08-13 NOTE — Assessment & Plan Note (Signed)
Pt advised of the following: Work on a diabetic diet, try to incorporate exercise at least 20-30 a day for 3 days a week or more.   

## 2022-08-13 NOTE — Assessment & Plan Note (Signed)
Continue metoprolol 25 mg Xl and lisinopril hctz  Continue f/u prn cardiologist.  Limit/stay away from caffeine for frequent pac's

## 2022-08-13 NOTE — Addendum Note (Signed)
Addended by: Pat Kocher on: 08/13/2022 08:34 AM   Modules accepted: Orders

## 2022-08-13 NOTE — Assessment & Plan Note (Signed)
Limit caffeine. Continue metoprolol XL 25 mg

## 2022-08-13 NOTE — Assessment & Plan Note (Signed)
Pt not fasting today, will f/u for fasting labs in two months at second shingrix vaccination appt.

## 2022-08-16 ENCOUNTER — Other Ambulatory Visit: Payer: Self-pay | Admitting: Family

## 2022-08-16 DIAGNOSIS — K219 Gastro-esophageal reflux disease without esophagitis: Secondary | ICD-10-CM

## 2022-08-22 ENCOUNTER — Encounter: Payer: Self-pay | Admitting: Gastroenterology

## 2022-10-10 ENCOUNTER — Ambulatory Visit: Payer: BC Managed Care – PPO | Admitting: Gastroenterology

## 2022-10-16 ENCOUNTER — Ambulatory Visit (INDEPENDENT_AMBULATORY_CARE_PROVIDER_SITE_OTHER): Payer: BC Managed Care – PPO | Admitting: *Deleted

## 2022-10-16 DIAGNOSIS — R7989 Other specified abnormal findings of blood chemistry: Secondary | ICD-10-CM

## 2022-10-16 DIAGNOSIS — Z23 Encounter for immunization: Secondary | ICD-10-CM | POA: Diagnosis not present

## 2022-10-16 DIAGNOSIS — R739 Hyperglycemia, unspecified: Secondary | ICD-10-CM

## 2022-10-16 DIAGNOSIS — K760 Fatty (change of) liver, not elsewhere classified: Secondary | ICD-10-CM | POA: Diagnosis not present

## 2022-10-16 DIAGNOSIS — E782 Mixed hyperlipidemia: Secondary | ICD-10-CM

## 2022-10-16 LAB — LIPID PANEL
Cholesterol: 167 mg/dL (ref 0–200)
HDL: 49.5 mg/dL (ref >39.00)
NonHDL: 117.88
Total CHOL/HDL Ratio: 3
Triglycerides: 319 mg/dL — ABNORMAL HIGH (ref 0.0–149.0)
VLDL: 63.8 mg/dL — ABNORMAL HIGH (ref 0.0–40.0)

## 2022-10-16 LAB — COMPREHENSIVE METABOLIC PANEL
ALT: 27 U/L (ref 0–35)
AST: 22 U/L (ref 0–37)
Albumin: 4.2 g/dL (ref 3.5–5.2)
Alkaline Phosphatase: 68 U/L (ref 39–117)
BUN: 14 mg/dL (ref 6–23)
CO2: 31 mEq/L (ref 19–32)
Calcium: 9.6 mg/dL (ref 8.4–10.5)
Chloride: 102 mEq/L (ref 96–112)
Creatinine, Ser: 0.55 mg/dL (ref 0.40–1.20)
GFR: 102.03 mL/min (ref >60.00)
Glucose, Bld: 106 mg/dL — ABNORMAL HIGH (ref 70–99)
Potassium: 4.1 mEq/L (ref 3.5–5.1)
Sodium: 141 mEq/L (ref 135–145)
Total Bilirubin: 0.4 mg/dL (ref 0.2–1.2)
Total Protein: 6.6 g/dL (ref 6.0–8.3)

## 2022-10-16 LAB — HEMOGLOBIN A1C: Hgb A1c MFr Bld: 6.2 % (ref 4.6–6.5)

## 2022-10-16 LAB — LDL CHOLESTEROL, DIRECT: Direct LDL: 68 mg/dL

## 2022-10-16 NOTE — Progress Notes (Signed)
Per orders of Eugenia Pancoast FNP, injection of Shingles vaccine given by Emelia Salisbury. Patient tolerated injection well.

## 2022-10-17 ENCOUNTER — Other Ambulatory Visit: Payer: Self-pay | Admitting: Family

## 2022-10-17 DIAGNOSIS — E782 Mixed hyperlipidemia: Secondary | ICD-10-CM

## 2022-10-17 MED ORDER — FENOFIBRATE 145 MG PO TABS: 145.0000 mg | ORAL_TABLET | Freq: Every day | ORAL | 3 refills | Status: DC

## 2022-10-17 MED ORDER — EZETIMIBE 10 MG PO TABS: 10.0000 mg | ORAL_TABLET | Freq: Every day | ORAL | 3 refills | Status: DC

## 2022-10-17 NOTE — Progress Notes (Signed)
Triglycerides are still too high confirm pt taking atorvastatin 80 mg once daily. Adding on zetia to take once daily to try to get control of these levels does pt drink alcohol eat a lot of cheese, butter, high fat milks/yogurts? Is she exercising? All of these can contribute to elevated cholesterol.   She needs to work on a strict low cholesterol diet and exercise as tolerated.

## 2022-10-17 NOTE — Addendum Note (Signed)
Addended by: Eugenia Pancoast on: 10/17/2022 11:58 AM   Modules accepted: Orders

## 2022-10-21 ENCOUNTER — Other Ambulatory Visit: Payer: Self-pay | Admitting: Primary Care

## 2022-10-21 DIAGNOSIS — I499 Cardiac arrhythmia, unspecified: Secondary | ICD-10-CM

## 2022-10-21 DIAGNOSIS — I491 Atrial premature depolarization: Secondary | ICD-10-CM

## 2022-10-25 ENCOUNTER — Telehealth: Payer: Self-pay | Admitting: Family

## 2022-10-25 NOTE — Telephone Encounter (Signed)
Patient returned call regarding lab results,She would like a call back.

## 2022-10-25 NOTE — Telephone Encounter (Signed)
See results note. 

## 2022-11-15 ENCOUNTER — Other Ambulatory Visit: Payer: Self-pay | Admitting: Family

## 2022-11-15 ENCOUNTER — Encounter: Payer: Self-pay | Admitting: Family

## 2022-11-15 DIAGNOSIS — F411 Generalized anxiety disorder: Secondary | ICD-10-CM

## 2022-11-15 NOTE — Telephone Encounter (Signed)
escitalopram (LEXAPRO) 10 MG tablet   LR- 08/14/22 (90/1) LV- 08/13/22 NV- 02/11/23

## 2022-12-07 ENCOUNTER — Other Ambulatory Visit: Payer: Self-pay

## 2022-12-11 ENCOUNTER — Other Ambulatory Visit: Payer: Self-pay

## 2022-12-11 ENCOUNTER — Ambulatory Visit (INDEPENDENT_AMBULATORY_CARE_PROVIDER_SITE_OTHER): Payer: BC Managed Care – PPO | Admitting: Gastroenterology

## 2022-12-11 ENCOUNTER — Encounter: Payer: Self-pay | Admitting: Gastroenterology

## 2022-12-11 VITALS — BP 145/86 | HR 70 | Temp 98.2°F | Ht 67.5 in | Wt 200.1 lb

## 2022-12-11 DIAGNOSIS — K625 Hemorrhage of anus and rectum: Secondary | ICD-10-CM

## 2022-12-11 DIAGNOSIS — K760 Fatty (change of) liver, not elsewhere classified: Secondary | ICD-10-CM | POA: Diagnosis not present

## 2022-12-11 MED ORDER — NA SULFATE-K SULFATE-MG SULF 17.5-3.13-1.6 GM/177ML PO SOLN: 354.0000 mL | Freq: Once | ORAL | 0 refills | Status: AC

## 2022-12-11 NOTE — Progress Notes (Signed)
Arlyss Repress, MD [address]  [address]  [place], [place] [address]  Main: [phone]  Fax: [phone]    Gastroenterology Consultation  Referring Provider:     Mort Sawyers, FNP Primary Care Physician:  Mort Sawyers, FNP Primary Gastroenterologist:  Dr. Arlyss Repress Reason for Consultation: Liver lesions, elevated ALT, rectal bleeding        HPI:   Beth Green is a 57 y.o. female referred by Dr. Mort Sawyers, FNP  for consultation & management of elevated ALT since 2019, less than 2 times the upper limit of normal.  Rest of the liver enzymes have been normal.  She does have hypertriglyceridemia and prediabetes.  She underwent right upper quadrant ultrasound in 05/2022 which revealed hepatic steatosis, 1.5 cm indeterminate hypoechoic mass within the left hepatic lobe and an additional small mass.  Subsequently, underwent MRI abdomen with contrast, which revealed severe diffuse hepatic steatosis, for liver lesions measuring from 6 mm to 11 mm in size, with primary differential including focal nodular hyperplasia and hepatic adenomas.  Follow-up MRI in 6 months with contrast has been recommended.  Patient is referred to GI for further evaluation.  Patient has hepatic steatosis based on the CT scan in 04/2017  Most recent labs from 3/24 revealed elevated triglycerides, started on Zetia as well as atorvastatin 80 mg daily, LFTs were normal including ALT.  HbA1c 6.2.  Patient has gained a significant amount of weight over the last few years.  She does admit to eating high carbohydrate foods.  She has severe constipation has bright red blood per rectum associated with abdominal bloating.  Her stools are generally hard, describes on Bristol stool scale as 1 or 2 and sometimes overflow diarrhea.  She tried fiber which resulted in severe cramps.  She is taking over-the-counter stool softener.  Has not tried MiraLAX.  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, excised 04/30/2018   Vulvar intraepithelial neoplasia (VIN) grade 3     Past Surgical History:  Procedure Laterality Date   INCISE AND DRAIN ABCESS  08/2016   spider bite (right index finger)   INCISION AND DRAINAGE ABSCESS N/A 04/28/2017   Procedure: INCISION AND DRAINAGE ABSCESS of Vulva;  Surgeon: Tilda Burrow, MD;  Location: [facility];  Service: Gynecology;  Laterality: N/A;   INCISION AND DRAINAGE ABSCESS N/A 04/30/2017   Procedure: INCISION AND DRAINAGE of Vulvar ABSCESS;  Surgeon: Tilda Burrow, MD;  Location: [facility];  Service: Gynecology;  Laterality: N/A;   LAPAROSCOPIC ASSISTED VAGINAL HYSTERECTOMY  2007   LESION REMOVAL N/A 04/18/2017   Procedure: EXCISION VAGINAL LESION - Vulvar Mass;  Surgeon: Reva Bores, MD;  Location: [facility];  Service: Gynecology;  Laterality: N/A;     Current Outpatient Medications:    aspirin EC 81 MG tablet, Take 1 tablet (81 mg total) by mouth  daily. Swallow whole., Disp: 90 tablet, Rfl: 3   atorvastatin (LIPITOR) 80 MG tablet, Take 1 tablet (80 mg total) by mouth daily., Disp: 90 tablet, Rfl: 3   diphenhydrAMINE (BENADRYL) 50 MG capsule, Take 50 mg by mouth as needed for sleep., Disp: , Rfl:    escitalopram (LEXAPRO) 10 MG tablet, TAKE 1 TABLET BY MOUTH EVERY DAY, Disp: 90 tablet, Rfl: 1   esomeprazole (NEXIUM) 20 MG packet, Take 20 mg by mouth daily before breakfast.,  Disp: , Rfl:    ezetimibe (ZETIA) 10 MG tablet, Take 1 tablet (10 mg total) by mouth daily., Disp: 90 tablet, Rfl: 3   fenofibrate (TRICOR) 145 MG tablet, Take 145 mg by mouth daily., Disp: , Rfl:    lisinopril-hydrochlorothiazide (ZESTORETIC) 20-12.5 MG tablet, Take 1 tablet by mouth daily., Disp: 90 tablet, Rfl: 3   metoprolol succinate (TOPROL-XL) 25 MG 24 hr tablet, TAKE 1 TABLET (25 MG TOTAL) BY MOUTH DAILY., Disp: 90 tablet, Rfl: 0   Multiple Vitamins-Minerals (CENTRUM ADULTS PO), , Disp: , Rfl:    Na Sulfate-K Sulfate-Mg Sulf 17.5-3.13-1.6 GM/177ML SOLN, Take 354 mLs by mouth once for 1 dose., Disp: 354 mL, Rfl: 0   Omega-3 Fatty Acids (FISH OIL) 1200 MG CAPS, , Disp: , Rfl:    Family History  Problem Relation Age of Onset   Diabetes Father    Kidney disease Father    Hypertension Paternal Grandfather    Colon cancer Neg Hx    Stomach cancer Neg Hx    Esophageal cancer Neg Hx      Social History   Tobacco Use   Smoking status: Every Day    Packs/day: 0.25    Years: 25.00    Additional pack years: 0.00    Total pack years: 6.25    Types: Cigarettes   Smokeless tobacco: Never   Tobacco comments:    D/w her about this, she states she occasionally considers quitting, has decreased where she smokes and how frequently.   Vaping Use   Vaping Use: Never used  Substance Use Topics   Alcohol use: Yes    Alcohol/week: 15.0 standard drinks of alcohol    Types: 10 Glasses of wine, 5 Shots of liquor per week    Comment: wine/bourbon 5 days a week   Drug use: No    Allergies as of 12/11/2022   (No Known Allergies)    Review of Systems:    All systems reviewed and negative except where noted in HPI.   Physical Exam:  BP (!) 152/88 (BP Location: Right Arm, Patient Position: Sitting, Cuff Size: Normal)   Pulse 75   Temp 98.2 F (36.8 C) (Oral)   Ht 5' 7.5" (1.715 m)   Wt 200 lb 2 oz (90.8 kg)   BMI 30.88 kg/m  No LMP recorded. Patient has had a hysterectomy.  General:    Alert,  Well-developed, well-nourished, pleasant and cooperative in NAD Head:  Normocephalic and atraumatic. Eyes:  Sclera clear, no icterus.   Conjunctiva pink. Ears:  Normal auditory acuity. Nose:  No deformity, discharge, or lesions. Mouth:  No deformity or lesions,oropharynx pink & moist. Neck:  Supple; no masses or thyromegaly. Lungs:  Respirations even and unlabored.  Clear throughout to auscultation.   No wheezes, crackles, or rhonchi. No acute distress. Heart:  Regular rate and rhythm; no murmurs, clicks, rubs, or gallops. Abdomen:  Normal bowel sounds. Soft, non-tender and non-distended without masses, hepatosplenomegaly or hernias noted.  No guarding or rebound tenderness.  Rectal: Not performed Msk:  Symmetrical without gross deformities. Good, equal movement & strength bilaterally. Pulses:  Normal pulses noted. Extremities:  No clubbing or edema.  No cyanosis. Neurologic:  Alert and oriented x3;  grossly normal neurologically. Skin:  Intact without significant lesions or rashes. No jaundice. Psych:  Alert and cooperative. Normal mood and affect.  Imaging Studies: Reviewed  Assessment and Plan:   Beth Green is a 57 y.o. 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 based on MRI in 05/2022 If it is FNH, we do not routinely obtain surveillance imaging to monitor asymptomatic patients with FNH because of the very low risk of lesion growth or complications  Hepatic adenomas: There is no presence of abdominal pain, and lesion is less than 5 cm in size, recommended to obtain obtain contrast-enhanced MRI in 6 months and then annually thereafter depending on further characterization of 6 months MRI scan, provided that the lesion is not growing (ie, ?20% increase in diameter)  and is not >5 cm in size  Last MRI was in 05/2022, discussed with patient regarding repeat MRI, she would like to postpone MRI to later part of this year because of the medical expenses that she has incurred from several studies within last 1 year  Diffuse hepatic steatosis in setting of metabolic syndrome HCV antibody negative Check Elita Boone FibroSure Discussed in length regarding importance of lifestyle modification with following healthy eating and vigorous physical activity at least 30 minutes daily Based on NASH FibroSure, she may be eligible for Resmetirom  Rectal bleeding, chronic constipation Discussed about high-fiber diet, fiber supplements Recommend cleanout with MiraLAX prep Start MiraLAX 34 g daily Colonoscopy with 2-day prep Discussed about outpatient hemorrhoid ligation after the colonoscopy  Follow up in 6 months   Arlyss Repress, MD

## 2022-12-11 NOTE — Patient Instructions (Signed)
Do Miralax clean out today or tomorrow.  Mix 64 ounces of Gatorade with 238 grams of miralax. Drink 8oz every 20 minutes till solution is gone.   High-Fiber Eating Plan Fiber, also called dietary fiber, is a type of carbohydrate. It is found foods such as fruits, vegetables, whole grains, and beans. A high-fiber diet can have many health benefits. Your health care provider may recommend a high-fiber diet to help: Prevent constipation. Fiber can make your bowel movements more regular. Lower your cholesterol. Relieve the following conditions: Inflammation of veins in the anus (hemorrhoids). Inflammation of specific areas of the digestive tract (uncomplicated diverticulosis). A problem of the large intestine, also called the colon, that sometimes causes pain and diarrhea (irritable bowel syndrome, or IBS). Prevent overeating as part of a weight-loss plan. Prevent heart disease, type 2 diabetes, and certain cancers. What are tips for following this plan? Reading food labels  Check the nutrition facts label on food products for the amount of dietary fiber. Choose foods that have 5 grams of fiber or more per serving. The goals for recommended daily fiber intake include: Men (age 28 or younger): 34-38 g. Men (over age 4): 28-34 g. Women (age 11 or younger): 25-28 g. Women (over age 13): 22-25 g. Your daily fiber goal is _____________ g. Shopping Choose whole fruits and vegetables instead of processed forms, such as apple juice or applesauce. Choose a wide variety of high-fiber foods such as avocados, lentils, oats, and kidney beans. Read the nutrition facts label of the foods you choose. Be aware of foods with added fiber. These foods often have high sugar and sodium amounts per serving. Cooking Use whole-grain flour for baking and cooking. Cook with brown rice instead of white rice. Meal planning Start the day with a breakfast that is high in fiber, such as a cereal that contains 5 g of  fiber or more per serving. Eat breads and cereals that are made with whole-grain flour instead of refined flour or white flour. Eat brown rice, bulgur wheat, or millet instead of white rice. Use beans in place of meat in soups, salads, and pasta dishes. Be sure that half of the grains you eat each day are whole grains. General information You can get the recommended daily intake of dietary fiber by: Eating a variety of fruits, vegetables, grains, nuts, and beans. Taking a fiber supplement if you are not able to take in enough fiber in your diet. It is better to get fiber through food than from a supplement. Gradually increase how much fiber you consume. If you increase your intake of dietary fiber too quickly, you may have bloating, cramping, or gas. Drink plenty of water to help you digest fiber. Choose high-fiber snacks, such as berries, raw vegetables, nuts, and popcorn. What foods should I eat? Fruits Berries. Pears. Apples. Oranges. Avocado. Prunes and raisins. Dried figs. Vegetables Sweet potatoes. Spinach. Kale. Artichokes. Cabbage. Broccoli. Cauliflower. Green peas. Carrots. Squash. Grains Whole-grain breads. Multigrain cereal. Oats and oatmeal. Brown rice. Barley. Bulgur wheat. Millet. Quinoa. Bran muffins. Popcorn. Rye wafer crackers. Meats and other proteins Navy beans, kidney beans, and pinto beans. Soybeans. Split peas. Lentils. Nuts and seeds. Dairy Fiber-fortified yogurt. Beverages Fiber-fortified soy milk. Fiber-fortified orange juice. Other foods Fiber bars. The items listed above may not be a complete list of recommended foods and beverages. Contact a dietitian for more information. What foods should I avoid? Fruits Fruit juice. Cooked, strained fruit. Vegetables Fried potatoes. Canned vegetables. Well-cooked vegetables. Grains White bread.  Pasta made with refined flour. White rice. Meats and other proteins Fatty cuts of meat. Fried chicken or fried  fish. Dairy Milk. Yogurt. Cream cheese. Sour cream. Fats and oils Butters. Beverages Soft drinks. Other foods Cakes and pastries. The items listed above may not be a complete list of foods and beverages to avoid. Talk with your dietitian about what choices are best for you. Summary Fiber is a type of carbohydrate. It is found in foods such as fruits, vegetables, whole grains, and beans. A high-fiber diet has many benefits. It can help to prevent constipation, lower blood cholesterol, aid weight loss, and reduce your risk of heart disease, diabetes, and certain cancers. Increase your intake of fiber gradually. Increasing fiber too quickly may cause cramping, bloating, and gas. Drink plenty of water while you increase the amount of fiber you consume. The best sources of fiber include whole fruits and vegetables, whole grains, nuts, seeds, and beans. This information is not intended to replace advice given to you by your health care provider. Make sure you discuss any questions you have with your health care provider. Document Revised: 11/26/2019 Document Reviewed: 11/26/2019 Elsevier Patient Education  2023 [employer].

## 2022-12-13 ENCOUNTER — Encounter: Payer: Self-pay | Admitting: Gastroenterology

## 2022-12-13 LAB — NASH FIBROSURE(R) PLUS
ALPHA 2-MACROGLOBULINS, QN: 171 mg/dL (ref 110–276)
ALT (SGPT) P5P: 89 IU/L — ABNORMAL HIGH (ref 0–40)
AST (SGOT) P5P: 58 IU/L — ABNORMAL HIGH (ref 0–40)
Apolipoprotein A-1: 156 mg/dL (ref 116–209)
Bilirubin, Total: 0.2 mg/dL (ref 0.0–1.2)
Cholesterol, Total: 131 mg/dL (ref 100–199)
Fibrosis Score: 0.07 (ref 0.00–0.21)
GGT: 40 IU/L (ref 0–60)
Glucose: 91 mg/dL (ref 70–99)
Haptoglobin: 182 mg/dL (ref 33–346)
NASH Score: 0.78 — ABNORMAL HIGH (ref 0.00–0.25)
Steatosis Score: 0.71 — ABNORMAL HIGH (ref 0.00–0.40)
Triglycerides: 243 mg/dL — ABNORMAL HIGH (ref 0–149)

## 2023-01-02 ENCOUNTER — Ambulatory Visit
Admission: RE | Admit: 2023-01-02 | Discharge: 2023-01-02 | Disposition: A | Discharge status: Home / Self Care | Payer: BC Managed Care – PPO | Attending: Gastroenterology | Admitting: Gastroenterology

## 2023-01-02 ENCOUNTER — Ambulatory Visit: Payer: BC Managed Care – PPO | Admitting: Anesthesiology

## 2023-01-02 ENCOUNTER — Encounter
Admission: RE | Disposition: A | Discharge status: Home / Self Care | Payer: Self-pay | Source: Home / Self Care | Attending: Gastroenterology

## 2023-01-02 ENCOUNTER — Telehealth: Payer: Self-pay

## 2023-01-02 ENCOUNTER — Encounter: Payer: Self-pay | Admitting: Gastroenterology

## 2023-01-02 ENCOUNTER — Other Ambulatory Visit: Payer: Self-pay

## 2023-01-02 DIAGNOSIS — Z1211 Encounter for screening for malignant neoplasm of colon: Secondary | ICD-10-CM | POA: Insufficient documentation

## 2023-01-02 DIAGNOSIS — F411 Generalized anxiety disorder: Secondary | ICD-10-CM | POA: Insufficient documentation

## 2023-01-02 DIAGNOSIS — Z79899 Other long term (current) drug therapy: Secondary | ICD-10-CM | POA: Insufficient documentation

## 2023-01-02 DIAGNOSIS — K644 Residual hemorrhoidal skin tags: Secondary | ICD-10-CM | POA: Insufficient documentation

## 2023-01-02 DIAGNOSIS — E7800 Pure hypercholesterolemia, unspecified: Secondary | ICD-10-CM | POA: Insufficient documentation

## 2023-01-02 DIAGNOSIS — I10 Essential (primary) hypertension: Secondary | ICD-10-CM | POA: Insufficient documentation

## 2023-01-02 DIAGNOSIS — K219 Gastro-esophageal reflux disease without esophagitis: Secondary | ICD-10-CM | POA: Diagnosis not present

## 2023-01-02 DIAGNOSIS — Z86010 Personal history of colonic polyps: Secondary | ICD-10-CM | POA: Diagnosis not present

## 2023-01-02 DIAGNOSIS — K625 Hemorrhage of anus and rectum: Secondary | ICD-10-CM | POA: Diagnosis not present

## 2023-01-02 DIAGNOSIS — F17210 Nicotine dependence, cigarettes, uncomplicated: Secondary | ICD-10-CM | POA: Diagnosis not present

## 2023-01-02 DIAGNOSIS — Z85828 Personal history of other malignant neoplasm of skin: Secondary | ICD-10-CM | POA: Diagnosis not present

## 2023-01-02 HISTORY — PX: COLONOSCOPY WITH PROPOFOL: SHX5780

## 2023-01-02 HISTORY — DX: Pure hypercholesterolemia, unspecified: E78.00

## 2023-01-02 SURGERY — COLONOSCOPY WITH PROPOFOL
Anesthesia: General

## 2023-01-02 MED ORDER — LIDOCAINE HCL (CARDIAC) PF 100 MG/5ML IV SOSY
PREFILLED_SYRINGE | INTRAVENOUS | Status: DC | PRN
  Administered 2023-01-02: 80 mg via INTRAVENOUS

## 2023-01-02 MED ORDER — PROPOFOL 500 MG/50ML IV EMUL
INTRAVENOUS | Status: DC | PRN
  Administered 2023-01-02: 150 ug/kg/min via INTRAVENOUS

## 2023-01-02 MED ORDER — PROPOFOL 10 MG/ML IV BOLUS
INTRAVENOUS | Status: DC | PRN
  Administered 2023-01-02: 50 mg via INTRAVENOUS

## 2023-01-02 MED ORDER — SODIUM CHLORIDE 0.9 % IV SOLN: INTRAVENOUS | Status: DC

## 2023-01-02 NOTE — Anesthesia Preprocedure Evaluation (Signed)
Anesthesia Evaluation  Patient identified by MRN, date of birth, ID band Patient awake    Reviewed: Allergy & Precautions, H&P , NPO status , Patient's Chart, lab work & pertinent test results, reviewed documented beta blocker date and time   Airway Mallampati: II   Neck ROM: full    Dental  (+) Poor Dentition   Pulmonary neg pulmonary ROS, Current Smoker   Pulmonary exam normal        Cardiovascular hypertension, On Medications negative cardio ROS Normal cardiovascular exam Rhythm:regular Rate:Normal     Neuro/Psych   Anxiety     negative neurological ROS  negative psych ROS   GI/Hepatic Neg liver ROS,GERD  Medicated,,  Endo/Other  negative endocrine ROS    Renal/GU negative Renal ROS  negative genitourinary   Musculoskeletal   Abdominal   Peds  Hematology  (+) Blood dyscrasia, anemia   Anesthesia Other Findings Past Medical History: No date: Actinic keratosis No date: Anemia No date: Anxiety No date: GAD (generalized anxiety disorder) No date: GERD (gastroesophageal reflux disease) No date: Head cold No date: History of hidradenitis suppurativa No date: History of uterine fibroid No date: Hypertension 03/06/2018: Nodular basal cell carcinoma     Comment:  left calf, excised 04/30/2018 No date: Vulvar intraepithelial neoplasia (VIN) grade 3 Past Surgical History: 08/2016: INCISE AND DRAIN ABCESS     Comment:  spider bite (right index finger) 04/28/2017: INCISION AND DRAINAGE ABSCESS; N/A     Comment:  Procedure: INCISION AND DRAINAGE ABSCESS of Vulva;                Surgeon: Tilda Burrow, MD;  Location: [facility];                Service: Gynecology;  Laterality: N/A; 04/30/2017: INCISION AND DRAINAGE ABSCESS; N/A     Comment:  Procedure: INCISION AND DRAINAGE of Vulvar ABSCESS;                Surgeon: Tilda Burrow, MD;  Location: [facility];                Service: Gynecology;  Laterality: N/A; 2007:  LAPAROSCOPIC ASSISTED VAGINAL HYSTERECTOMY 04/18/2017: LESION REMOVAL; N/A     Comment:  Procedure: EXCISION VAGINAL LESION - Vulvar Mass;                Surgeon: Reva Bores, MD;  [facility];  Service: Gynecology;  Laterality: N/A;   Reproductive/Obstetrics negative OB ROS                             Anesthesia Physical Anesthesia Plan  ASA: 2  Anesthesia Plan: General   Post-op Pain Management:    Induction:   PONV Risk Score and Plan:   Airway Management Planned:   Additional Equipment:   Intra-op Plan:   Post-operative Plan:   Informed Consent: I have reviewed the patients History and Physical, chart, labs and discussed the procedure including the risks, benefits and alternatives for the proposed anesthesia with the patient or authorized representative who has indicated his/her understanding and acceptance.     Dental Advisory Given  Plan Discussed with: CRNA  Anesthesia Plan Comments:        Anesthesia Quick Evaluation

## 2023-01-02 NOTE — Transfer of Care (Signed)
Immediate Anesthesia Transfer of Care Note  Patient: Beth Green  Procedure(s) Performed: COLONOSCOPY WITH PROPOFOL  Patient Location: PACU  Anesthesia Type:General  Level of Consciousness: sedated  Airway & Oxygen Therapy: Patient Spontanous Breathing  Post-op Assessment: Report given to RN and Post -op Vital signs reviewed and stable  Post vital signs: Reviewed and stable  Last Vitals:  Vitals Value Taken Time  BP 116/70 01/02/23 1132  Temp    Pulse 80 01/02/23 1132  Resp 19 01/02/23 1132  SpO2 94 % 01/02/23 1132  Vitals shown include unvalidated device data.  Last Pain:  Vitals:   01/02/23 1038  TempSrc: Temporal  PainSc: 0-No pain         Complications: No notable events documented.

## 2023-01-02 NOTE — Op Note (Signed)
[facility] Patient Name: Beth Green Procedure Date: 01/02/2023 10:53 AM MRN: [id number] Account #: [id number] Date of Birth: 1966/03/03 Admit Type: Outpatient Age: 57 Room: [facility] ENDO ROOM 4 Gender: Female Note Status: Finalized Instrument Name: Prentice Docker [id number] Procedure:             Colonoscopy Indications:           Surveillance: Personal history of adenomatous polyps                         on last colonoscopy 5 years ago, Last colonoscopy:                         January 2019 Providers:             Toney Reil MD, MD Referring MD:          Mort Sawyers (Referring MD) Medicines:             General Anesthesia Complications:         No immediate complications. Estimated blood loss: None. Procedure:             Pre-Anesthesia Assessment:                        - Prior to the procedure, a History and Physical was                         performed, and patient medications and allergies were                         reviewed. The patient is competent. The risks and                         benefits of the procedure and the sedation options and                         risks were discussed with the patient. All questions                         were answered and informed consent was obtained.                         Patient identification and proposed procedure were                         verified by the physician, the nurse, the                         anesthesiologist, the anesthetist and the technician                         in the pre-procedure area in the procedure room in the                         endoscopy suite. Mental Status Examination: alert and                         oriented. Airway Examination: normal oropharyngeal  airway and neck mobility. Respiratory Examination:                         clear to auscultation. CV Examination: normal.                         Prophylactic Antibiotics: The patient  does not require                         prophylactic antibiotics. Prior Anticoagulants: The                         patient has taken no anticoagulant or antiplatelet                         agents. ASA Grade Assessment: II - A patient with mild                         systemic disease. After reviewing the risks and                         benefits, the patient was deemed in satisfactory                         condition to undergo the procedure. The anesthesia                         plan was to use general anesthesia. Immediately prior                         to administration of medications, the patient was                         re-assessed for adequacy to receive sedatives. The                         heart rate, respiratory rate, oxygen saturations,                         blood pressure, adequacy of pulmonary ventilation, and                         response to care were monitored throughout the                         procedure. The physical status of the patient was                         re-assessed after the procedure.                        After obtaining informed consent, the colonoscope was                         passed under direct vision. Throughout the procedure,                         the patient's blood pressure, pulse, and oxygen  saturations were monitored continuously. The                         Colonoscope was introduced through the anus and                         advanced to the the cecum, identified by appendiceal                         orifice and ileocecal valve. The colonoscopy was                         performed without difficulty. The patient tolerated                         the procedure well. The quality of the bowel                         preparation was evaluated using the BBPS [facility] Bowel                         Preparation Scale) with scores of: Right Colon = 3,                         Transverse Colon = 3 and Left  Colon = 3 (entire mucosa                         seen well with no residual staining, small fragments                         of stool or opaque liquid). The total BBPS score                         equals 9. The ileocecal valve, appendiceal orifice,                         and rectum were photographed. Findings:      Skin tags were found on perianal exam.      Non-bleeding external hemorrhoids were found during retroflexion. The       hemorrhoids were large.      The entire examined colon appeared normal. Impression:            - Perianal skin tags found on perianal exam.                        - Non-bleeding external hemorrhoids.                        - The entire examined colon is normal.                        - No specimens collected. Recommendation:        - Discharge patient to home (with escort).                        - Resume previous diet today.                        -  Continue present medications.                        - Repeat colonoscopy in 10 years for screening                         purposes.                        - Return to my office for hemorrhoid banding. Procedure Code(s):     --- Professional ---                        Z6109, Colorectal cancer screening; colonoscopy on                         individual at high risk Diagnosis Code(s):     --- Professional ---                        K64.4, Residual hemorrhoidal skin tags                        Z86.010, Personal history of colonic polyps CPT copyright 2022 American Medical Association. All rights reserved. The codes documented in this report are preliminary and upon coder review may  be revised to meet current compliance requirements. Dr. Libby Maw Toney Reil MD, MD 01/02/2023 11:32:40 AM This report has been signed electronically. Number of Addenda: 0 Note Initiated On: 01/02/2023 10:53 AM Scope Withdrawal Time: 0 hours 8 minutes 10 seconds  Total Procedure Duration: 0 hours 14 minutes 8 seconds        [facility]

## 2023-01-02 NOTE — Telephone Encounter (Signed)
-----   Message from Toney Reil, MD sent at 01/02/2023 11:40 AM EDT ----- Regarding: banding Please schedule hemorrhoid banding  RV

## 2023-01-02 NOTE — H&P (Signed)
Arlyss Repress, MD [address]  [address]  [place], [place] [address]  Main: [phone]  Fax: [phone] Pager: [phone]  Primary Care Physician:  Mort Sawyers, FNP Primary Gastroenterologist:  Dr. Arlyss Repress  Pre-Procedure History & Physical: HPI:  Beth Green is a 57 y.o. female is here for an colonoscopy.   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, excised 04/30/2018   Vulvar intraepithelial neoplasia (VIN) grade 3     Past Surgical History:  Procedure Laterality Date   INCISE AND DRAIN ABCESS  08/2016   spider bite (right index finger)   INCISION AND DRAINAGE ABSCESS N/A 04/28/2017   Procedure: INCISION AND DRAINAGE ABSCESS of Vulva;  Surgeon: Tilda Burrow, MD;  Location: [facility];  Service: Gynecology;  Laterality: N/A;   INCISION AND DRAINAGE ABSCESS N/A 04/30/2017   Procedure: INCISION AND DRAINAGE of Vulvar ABSCESS;  Surgeon: Tilda Burrow, MD;  Location: [facility];  Service: Gynecology;  Laterality: N/A;   LAPAROSCOPIC ASSISTED VAGINAL HYSTERECTOMY  2007   LESION REMOVAL N/A 04/18/2017   Procedure: EXCISION VAGINAL LESION - Vulvar Mass;  Surgeon: Reva Bores, MD;  Location: [facility];  Service: Gynecology;  Laterality: N/A;    Prior to Admission medications   Medication Sig Start Date End Date Taking? Authorizing Provider  aspirin EC 81 MG tablet Take 1 tablet (81 mg total) by mouth daily. Swallow whole. 07/06/22  Yes Agbor-Etang, Arlys John, MD  atorvastatin (LIPITOR) 80 MG tablet Take 1 tablet (80 mg total) by mouth daily. 04/26/22  Yes Dugal, Wyatt Mage, FNP  escitalopram (LEXAPRO) 10 MG tablet TAKE 1 TABLET BY MOUTH EVERY DAY 11/15/22  Yes  Dugal, Tabitha, FNP  ezetimibe (ZETIA) 10 MG tablet Take 1 tablet (10 mg total) by mouth daily. 10/17/22  Yes Dugal, Wyatt Mage, FNP  fenofibrate (TRICOR) 145 MG tablet Take 145 mg by mouth daily. 10/17/22  Yes [provider]  lisinopril-hydrochlorothiazide (ZESTORETIC) 20-12.5 MG tablet Take 1 tablet by mouth daily. 04/25/22  Yes Dugal, Wyatt Mage, FNP  metoprolol succinate (TOPROL-XL) 25 MG 24 hr tablet TAKE 1 TABLET (25 MG TOTAL) BY MOUTH DAILY. 10/22/22  Yes Dugal, Wyatt Mage, FNP  diphenhydrAMINE (BENADRYL) 50 MG capsule Take 50 mg by mouth as needed for sleep.    [provider]  esomeprazole (NEXIUM) 20 MG packet Take 20 mg by mouth daily before breakfast.    [provider]  Multiple Vitamins-Minerals (CENTRUM ADULTS PO)  06/06/21   [provider]  Omega-3 Fatty Acids (FISH OIL) 1200 MG CAPS  05/06/20   [provider]    Allergies as of 12/11/2022   (No Known Allergies)    Family History  Problem Relation Age of Onset   Diabetes Father    Kidney disease Father    Hypertension Paternal Grandfather    Colon cancer Neg Hx    Stomach cancer Neg Hx    Esophageal cancer Neg Hx     Social History   Socioeconomic History   Marital status: Married    Spouse name: Not on file   Number of children: Not on file   Years  of education: Not on file   Highest education level: Not on file  Occupational History   Occupation: Haematologist  Tobacco Use   Smoking status: Every Day    Packs/day: 0.25    Years: 25.00    Additional pack years: 0.00    Total pack years: 6.25    Types: Cigarettes   Smokeless tobacco: Never   Tobacco comments:    D/w her about this, she states she occasionally considers quitting, has decreased where she smokes and how frequently.   Vaping Use   Vaping Use: Never used  Substance and Sexual Activity   Alcohol use: Yes    Alcohol/week: 15.0 standard drinks of alcohol    Types: 10 Glasses of wine, 5 Shots of liquor per week     Comment: wine/bourbon 5 days a week   Drug use: No   Sexual activity: Yes    Partners: Male    Birth control/protection: Surgical    Comment: Hysterectomy  Other Topics Concern   Not on file  Social History Narrative   Not on file   Social Determinants of Health   Financial Resource Strain: Not on file  Food Insecurity: Not on file  Transportation Needs: Not on file  Physical Activity: Not on file  Stress: Not on file  Social Connections: Not on file  Intimate Partner Violence: Not on file    Review of Systems: See HPI, otherwise negative ROS  Physical Exam: BP (!) 137/91   Pulse 83   Temp (!) 97.5 F (36.4 C) (Temporal)   Resp 18   Ht 5' 7.5" (1.715 m)   Wt 86.6 kg   SpO2 97%   BMI 29.47 kg/m  General:   Alert,  pleasant and cooperative in NAD Head:  Normocephalic and atraumatic. Neck:  Supple; no masses or thyromegaly. Lungs:  Clear throughout to auscultation.    Heart:  Regular rate and rhythm. Abdomen:  Soft, nontender and nondistended. Normal bowel sounds, without guarding, and without rebound.   Neurologic:  Alert and  oriented x4;  grossly normal neurologically.  Impression/Plan: [name] is here for an colonoscopy to be performed for h/o colon adenomas  Risks, benefits, limitations, and alternatives regarding  colonoscopy have been reviewed with the patient.  Questions have been answered.  All parties agreeable.   Lannette Donath, MD  01/02/2023, 10:55 AM

## 2023-01-02 NOTE — Anesthesia Postprocedure Evaluation (Signed)
Anesthesia Post Note  Patient: Beth Green  Procedure(s) Performed: COLONOSCOPY WITH PROPOFOL  Patient location during evaluation: PACU Anesthesia Type: General Level of consciousness: awake and alert Pain management: pain level controlled Vital Signs Assessment: post-procedure vital signs reviewed and stable Respiratory status: spontaneous breathing, nonlabored ventilation, respiratory function stable and patient connected to nasal cannula oxygen Cardiovascular status: blood pressure returned to baseline and stable Postop Assessment: no apparent nausea or vomiting Anesthetic complications: no   No notable events documented.   Last Vitals:  Vitals:   01/02/23 1132 01/02/23 1142  BP: 116/70 127/78  Pulse: 80 78  Resp: 19 (!) 22  Temp:    SpO2: 94% 97%    Last Pain:  Vitals:   01/02/23 1142  TempSrc:   PainSc: 0-No pain                 [name]

## 2023-01-02 NOTE — Telephone Encounter (Signed)
Made appointment 01/16/2023

## 2023-01-03 ENCOUNTER — Encounter: Payer: Self-pay | Admitting: Gastroenterology

## 2023-01-08 ENCOUNTER — Other Ambulatory Visit: Payer: Self-pay | Admitting: Family

## 2023-01-08 DIAGNOSIS — I491 Atrial premature depolarization: Secondary | ICD-10-CM

## 2023-01-08 DIAGNOSIS — I499 Cardiac arrhythmia, unspecified: Secondary | ICD-10-CM

## 2023-01-12 ENCOUNTER — Other Ambulatory Visit: Payer: Self-pay | Admitting: Family

## 2023-01-12 DIAGNOSIS — I10 Essential (primary) hypertension: Secondary | ICD-10-CM

## 2023-01-12 DIAGNOSIS — E782 Mixed hyperlipidemia: Secondary | ICD-10-CM

## 2023-01-16 ENCOUNTER — Encounter: Payer: Self-pay | Admitting: Gastroenterology

## 2023-01-16 ENCOUNTER — Other Ambulatory Visit: Payer: Self-pay

## 2023-01-16 ENCOUNTER — Ambulatory Visit (INDEPENDENT_AMBULATORY_CARE_PROVIDER_SITE_OTHER): Payer: BC Managed Care – PPO | Admitting: Gastroenterology

## 2023-01-16 VITALS — BP 128/82 | HR 77 | Temp 98.1°F | Ht 67.5 in | Wt 199.5 lb

## 2023-01-16 DIAGNOSIS — K640 First degree hemorrhoids: Secondary | ICD-10-CM | POA: Diagnosis not present

## 2023-01-16 NOTE — Progress Notes (Signed)
PROCEDURE NOTE: The patient presents with symptomatic grade 1 hemorrhoids, unresponsive to maximal medical therapy, requesting rubber band ligation of his/her hemorrhoidal disease.  All risks, benefits and alternative forms of therapy were described and informed consent was obtained.   The decision was made to band the RP internal hemorrhoid, and the [facility] System was used to perform band ligation without complication.  Digital anorectal examination was then performed to assure proper positioning of the band, and to adjust the banded tissue as required.  The patient was discharged home without pain or other issues.  Dietary and behavioral recommendations were given and (if necessary - prescriptions were given), along with follow-up instructions.  The patient will return 4 weeks for follow-up and possible additional banding as required.  No complications were encountered and the patient tolerated the procedure well.   

## 2023-01-16 NOTE — Progress Notes (Signed)
Arlyss Repress, MD [address]  [address]  [place], [place] [address]  Main: [phone]  Fax: [phone]    Gastroenterology Consultation  Referring Provider:     Mort Sawyers, FNP Primary Care Physician:  Mort Sawyers, FNP Primary Gastroenterologist:  Dr. Arlyss Repress Reason for Consultation: Liver lesions, elevated ALT, rectal bleeding        HPI:   Beth Green is a 57 y.o. female referred by Dr. Mort Sawyers, FNP  for consultation & management of elevated ALT since 2019, less than 2 times the upper limit of normal.  Rest of the liver enzymes have been normal.  She does have hypertriglyceridemia and prediabetes.  She underwent right upper quadrant ultrasound in 05/2022 which revealed hepatic steatosis, 1.5 cm indeterminate hypoechoic mass within the left hepatic lobe and an additional small mass.  Subsequently, underwent MRI abdomen with contrast, which revealed severe diffuse hepatic steatosis, for liver lesions measuring from 6 mm to 11 mm in size, with primary differential including focal nodular hyperplasia and hepatic adenomas.  Follow-up MRI in 6 months with contrast has been recommended.  Patient is referred to GI for further evaluation.  Patient has hepatic steatosis based on the CT scan in 04/2017  Most recent labs from 3/24 revealed elevated triglycerides, started on Zetia as well as atorvastatin 80 mg daily, LFTs were normal including ALT.  HbA1c 6.2.  Patient has gained a significant amount of weight over the last few years.  She does admit to eating high carbohydrate foods.  She has severe constipation has bright red blood per rectum associated with abdominal bloating.  Her stools are generally hard, describes on Bristol stool scale as 1 or 2 and sometimes overflow diarrhea.  She tried fiber which resulted in severe cramps.  She is taking over-the-counter stool softener.  Has not tried MiraLAX.  Drinks alcohol daily up to 15 drinks per week Does smoke  cigarettes She denies any family history of GI malignancy or chronic liver condition  Follow-up visit 01/16/2023 Beth Green is here to discuss about hemorrhoid banding.  She has been experiencing symptoms of rectal bleeding, pain, itching and discomfort.  She reports that her constipation is improving.  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, excised 04/30/2018   Vulvar intraepithelial neoplasia (VIN) grade 3     Past Surgical History:  Procedure Laterality Date   COLONOSCOPY WITH PROPOFOL N/A 01/02/2023   Procedure: COLONOSCOPY WITH PROPOFOL;  Surgeon: Toney Reil, MD;  Location: [facility];  Service: Gastroenterology;  Laterality: N/A;   INCISE AND DRAIN ABCESS  08/2016   spider bite (right index finger)   INCISION AND DRAINAGE ABSCESS N/A 04/28/2017   Procedure: INCISION AND DRAINAGE ABSCESS of Vulva;  Surgeon: Tilda Burrow, MD;  Location: [facility];  Service: Gynecology;  Laterality: N/A;   INCISION AND DRAINAGE ABSCESS N/A 04/30/2017   Procedure: INCISION AND DRAINAGE of Vulvar  ABSCESS;  Surgeon: Tilda Burrow, MD;  Location: [facility];  Service: Gynecology;  Laterality: N/A;   LAPAROSCOPIC ASSISTED VAGINAL HYSTERECTOMY  2007   LESION REMOVAL N/A 04/18/2017   Procedure: EXCISION VAGINAL LESION - Vulvar Mass;  Surgeon: Reva Bores, MD;  Location: [facility] ;  Service: Gynecology;  Laterality: N/A;      Current Outpatient Medications:    aspirin EC 81 MG tablet, Take 1 tablet (81 mg total) by mouth daily. Swallow whole., Disp: 90 tablet, Rfl: 3   atorvastatin (LIPITOR) 80 MG tablet, TAKE 1 TABLET BY MOUTH EVERY DAY, Disp: 90 tablet, Rfl: 3   clindamycin (CLEOCIN T) 1 % lotion, Apply topically., Disp: , Rfl:    diphenhydrAMINE (BENADRYL) 50 MG capsule, Take 50 mg by mouth as needed for sleep., Disp: , Rfl:    escitalopram (LEXAPRO) 10 MG tablet, TAKE 1 TABLET BY MOUTH EVERY DAY, Disp: 90 tablet, Rfl: 1   esomeprazole (NEXIUM) 20 MG packet, Take 20 mg by mouth daily before breakfast., Disp: , Rfl:    ezetimibe (ZETIA) 10 MG tablet, Take 1 tablet (10 mg total) by mouth daily., Disp: 90 tablet, Rfl: 3   fenofibrate (TRICOR) 145 MG tablet, Take 145 mg by mouth daily., Disp: , Rfl:    fluorouracil (EFUDEX) 5 % cream, Apply 1 Application topically 2 (two) times daily., Disp: , Rfl:    lisinopril-hydrochlorothiazide (ZESTORETIC) 20-12.5 MG tablet, TAKE 1 TABLET BY MOUTH EVERY DAY, Disp: 90 tablet, Rfl: 3   metoprolol succinate (TOPROL-XL) 25 MG 24 hr tablet, TAKE 1 TABLET (25 MG TOTAL) BY MOUTH DAILY., Disp: 90 tablet, Rfl: 3   Multiple Vitamins-Minerals (CENTRUM ADULTS PO), , Disp: , Rfl:    Omega-3 Fatty Acids (FISH OIL) 1200 MG CAPS, , Disp: , Rfl:    Family History  Problem Relation Age of Onset   Diabetes Father    Kidney disease Father    Hypertension Paternal Grandfather    Colon cancer Neg Hx    Stomach cancer Neg Hx    Esophageal cancer Neg Hx      Social History   Tobacco Use   Smoking status: Every Day    Packs/day: 0.25    Years: 25.00    Additional pack years: 0.00    Total pack years: 6.25    Types: Cigarettes   Smokeless tobacco: Never   Tobacco comments:    D/w her about this, she states she occasionally considers quitting, has decreased where she smokes and how frequently.   Vaping Use   Vaping Use: Never used  Substance Use Topics   Alcohol use: Yes     Alcohol/week: 15.0 standard drinks of alcohol    Types: 10 Glasses of wine, 5 Shots of liquor per week    Comment: wine/bourbon 5 days a week   Drug use: No    Allergies as of 01/16/2023   (No Known Allergies)    Review of Systems:    All systems reviewed and negative except where noted in HPI.   Physical Exam:  BP 128/82 (BP Location: Left Arm, Patient Position: Sitting, Cuff Size: Normal)   Pulse 77   Temp 98.1 F (36.7 C) (Oral)   Ht 5' 7.5" (1.715 m)   Wt 199 lb 8 oz (90.5 kg)   BMI 30.78 kg/m  No LMP recorded. Patient has had a hysterectomy.  General:   Alert,  Well-developed, well-nourished, pleasant and cooperative in NAD Head:  Normocephalic and atraumatic. Eyes:  Sclera clear,  no icterus.   Conjunctiva pink. Ears:  Normal auditory acuity. Nose:  No deformity, discharge, or lesions. Mouth:  No deformity or lesions,oropharynx pink & moist. Neck:  Supple; no masses or thyromegaly. Lungs:  Respirations even and unlabored.  Clear throughout to auscultation.   No wheezes, crackles, or rhonchi. No acute distress. Heart:  Regular rate and rhythm; no murmurs, clicks, rubs, or gallops. Abdomen:  Normal bowel sounds. Soft, non-tender and non-distended without masses, hepatosplenomegaly or hernias noted.  No guarding or rebound tenderness.   Rectal: Nontender digital rectal exam, perianal skin tags Msk:  Symmetrical without gross deformities. Good, equal movement & strength bilaterally. Pulses:  Normal pulses noted. Extremities:  No clubbing or edema.  No cyanosis. Neurologic:  Alert and oriented x3;  grossly normal neurologically. Skin:  Intact without significant lesions or rashes. No jaundice. Psych:  Alert and cooperative. Normal mood and affect.  Imaging Studies: Reviewed  Assessment and Plan:   Beth Green is a 57 y.o. 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 based on MRI in 05/2022 If it is FNH, we do not routinely obtain surveillance imaging to monitor asymptomatic patients with FNH because of the very low risk of lesion growth or complications  Hepatic adenomas: There is no presence of abdominal pain, and lesion is less than 5 cm in size, recommended to obtain obtain contrast-enhanced MRI in 6 months and then annually thereafter depending on further characterization of 6 months MRI scan, provided that the lesion is not growing (ie, ?20% increase in diameter) and is not >5 cm in size  Last MRI was in 05/2022, discussed with patient regarding repeat MRI, she would like to postpone MRI to later part of this year because of the medical expenses that she has incurred from several studies within last 1 year  Diffuse hepatic steatosis in setting of metabolic syndrome HCV antibody negative Check Elita Boone FibroSure Discussed in length regarding importance of lifestyle modification with following healthy eating and vigorous physical activity at least 30 minutes daily Based on NASH FibroSure, she may be eligible for Resmetirom  Rectal bleeding, chronic constipation Discussed about high-fiber diet, fiber supplements Continue Metamucil as well as MiraLAX with large cup of water daily Discussed about outpatient hemorrhoid ligation, procedure, risks and benefits Consent obtained, proceed with hemorrhoid ligation today  Follow up in 4 to 6 weeks   Arlyss Repress, MD

## 2023-02-11 ENCOUNTER — Ambulatory Visit: Payer: BC Managed Care – PPO | Admitting: Family

## 2023-02-18 ENCOUNTER — Encounter: Payer: Self-pay | Admitting: Family

## 2023-02-18 ENCOUNTER — Ambulatory Visit: Payer: BC Managed Care – PPO | Admitting: Family

## 2023-02-18 VITALS — BP 136/80 | HR 76 | Wt 201.0 lb

## 2023-02-18 DIAGNOSIS — R7303 Prediabetes: Secondary | ICD-10-CM | POA: Diagnosis not present

## 2023-02-18 DIAGNOSIS — R739 Hyperglycemia, unspecified: Secondary | ICD-10-CM | POA: Diagnosis not present

## 2023-02-18 DIAGNOSIS — I499 Cardiac arrhythmia, unspecified: Secondary | ICD-10-CM | POA: Diagnosis not present

## 2023-02-18 DIAGNOSIS — F411 Generalized anxiety disorder: Secondary | ICD-10-CM

## 2023-02-18 DIAGNOSIS — E782 Mixed hyperlipidemia: Secondary | ICD-10-CM | POA: Diagnosis not present

## 2023-02-18 NOTE — Assessment & Plan Note (Signed)
Ordered lipid panel, pending results. Work on low cholesterol diet and exercise as tolerated  

## 2023-02-18 NOTE — Assessment & Plan Note (Signed)
Continue metoprolol  Improved.

## 2023-02-18 NOTE — Assessment & Plan Note (Signed)
Repeat A1c in six months, work on prediabetic diet.

## 2023-02-18 NOTE — Assessment & Plan Note (Signed)
Stable Continue lexapro 10 mg once daily  

## 2023-02-18 NOTE — Progress Notes (Signed)
Established Patient Office Visit  Subjective:      CC:  Chief Complaint  Patient presents with   blood pressure f/u    HPI: Beth Green is a 57 y.o. female presenting on 02/18/2023 for blood pressure f/u . HTN: still on metoprolol and lisinopril daily. Seeing cardiology once a year and or prn.   PAC: improved with metoprolol.    HLD: taking atorvastatin 80 mg once daily. Was advised to start zetia 10 mg and she thinks she is taking this but will check at home. Trying to work on a low cholesterol diet more.   Lab Results  Component Value Date   CHOL 131 12/11/2022   HDL 49.50 10/16/2022   LDLCALC 141 (H) 02/15/2017   LDLDIRECT 68.0 10/16/2022   TRIG 243 (H) 12/11/2022   CHOLHDL 3 10/16/2022   Prediabetes: trying to work a bit on diet. Paying more attention.  Lab Results  Component Value Date   HGBA1C 6.2 10/16/2022    Liver lesions: now seeing Dr. Allegra Lai, she is  recommending pt to get repeat MRI in October in one year rather than 6 months (due to financial concerns of test with patient requesting later scanning)      Social history:  Relevant past medical, surgical, family and social history reviewed and updated as indicated. Interim medical history since our last visit reviewed.  Allergies and medications reviewed and updated.  DATA REVIEWED: CHART IN EPIC     ROS: Negative unless specifically indicated above in HPI.    Current Outpatient Medications:    aspirin EC 81 MG tablet, Take 1 tablet (81 mg total) by mouth daily. Swallow whole., Disp: 90 tablet, Rfl: 3   atorvastatin (LIPITOR) 80 MG tablet, TAKE 1 TABLET BY MOUTH EVERY DAY, Disp: 90 tablet, Rfl: 3   clindamycin (CLEOCIN T) 1 % lotion, Apply topically., Disp: , Rfl:    diphenhydrAMINE (BENADRYL) 50 MG capsule, Take 50 mg by mouth as needed for sleep., Disp: , Rfl:    escitalopram (LEXAPRO) 10 MG tablet, TAKE 1 TABLET BY MOUTH EVERY DAY, Disp: 90 tablet, Rfl: 1   esomeprazole (NEXIUM) 20 MG  packet, Take 20 mg by mouth daily before breakfast., Disp: , Rfl:    ezetimibe (ZETIA) 10 MG tablet, Take 1 tablet (10 mg total) by mouth daily., Disp: 90 tablet, Rfl: 3   fenofibrate (TRICOR) 145 MG tablet, Take 145 mg by mouth daily., Disp: , Rfl:    fluorouracil (EFUDEX) 5 % cream, Apply 1 Application topically 2 (two) times daily., Disp: , Rfl:    lisinopril-hydrochlorothiazide (ZESTORETIC) 20-12.5 MG tablet, TAKE 1 TABLET BY MOUTH EVERY DAY, Disp: 90 tablet, Rfl: 3   metoprolol succinate (TOPROL-XL) 25 MG 24 hr tablet, TAKE 1 TABLET (25 MG TOTAL) BY MOUTH DAILY., Disp: 90 tablet, Rfl: 3   Multiple Vitamins-Minerals (CENTRUM ADULTS PO), , Disp: , Rfl:    Omega-3 Fatty Acids (FISH OIL) 1200 MG CAPS, , Disp: , Rfl:       Objective:    BP 136/80   Pulse 76   Wt 201 lb (91.2 kg)   SpO2 96%   BMI 31.02 kg/m   Wt Readings from Last 3 Encounters:  02/18/23 201 lb (91.2 kg)  01/16/23 199 lb 8 oz (90.5 kg)  01/02/23 191 lb (86.6 kg)    Physical Exam Vitals reviewed.  Constitutional:      General: She is not in acute distress.    Appearance: Normal appearance. She is normal weight. She  is not ill-appearing, toxic-appearing or diaphoretic.  HENT:     Head: Normocephalic.  Cardiovascular:     Rate and Rhythm: Normal rate and regular rhythm.  Pulmonary:     Effort: Pulmonary effort is normal.     Breath sounds: Normal breath sounds.  Musculoskeletal:        General: Normal range of motion.     Right lower leg: No edema.     Left lower leg: No edema.  Neurological:     General: No focal deficit present.     Mental Status: She is alert and oriented to person, place, and time. Mental status is at baseline.  Psychiatric:        Mood and Affect: Mood normal.        Behavior: Behavior normal.        Thought Content: Thought content normal.        Judgment: Judgment normal.           Assessment & Plan:  Mixed hyperlipidemia Assessment & Plan: Ordered lipid panel, pending  results. Work on low cholesterol diet and exercise as tolerated   Orders: -     Lipid panel; Future  Prediabetes -     Hemoglobin A1c; Future  Irregular heart beat Assessment & Plan: Continue metoprolol  Improved.   Hyperglycemia Assessment & Plan: Repeat A1c in six months, work on prediabetic diet.    Generalized anxiety disorder Assessment & Plan: Stable.  Continue lexapro 10 mg once daily.       Return in about 1 year (around 02/18/2024) for f/u CPE.  Mort Sawyers, MSN, APRN, FNP-C [facility]

## 2023-02-27 ENCOUNTER — Other Ambulatory Visit (INDEPENDENT_AMBULATORY_CARE_PROVIDER_SITE_OTHER): Payer: BC Managed Care – PPO

## 2023-02-27 DIAGNOSIS — R7303 Prediabetes: Secondary | ICD-10-CM | POA: Diagnosis not present

## 2023-02-27 DIAGNOSIS — E782 Mixed hyperlipidemia: Secondary | ICD-10-CM | POA: Diagnosis not present

## 2023-02-27 LAB — LIPID PANEL
Cholesterol: 114 mg/dL (ref 0–200)
HDL: 41.2 mg/dL (ref >39.00)
LDL Cholesterol: 34 mg/dL (ref 0–99)
NonHDL: 72.34
Total CHOL/HDL Ratio: 3
Triglycerides: 190 mg/dL — ABNORMAL HIGH (ref 0.0–149.0)
VLDL: 38 mg/dL (ref 0.0–40.0)

## 2023-02-27 LAB — HEMOGLOBIN A1C: Hgb A1c MFr Bld: 6.3 % (ref 4.6–6.5)

## 2023-04-11 ENCOUNTER — Encounter: Payer: Self-pay | Admitting: Family

## 2023-04-16 ENCOUNTER — Ambulatory Visit: Payer: BC Managed Care – PPO | Admitting: Gastroenterology

## 2023-04-24 ENCOUNTER — Ambulatory Visit (INDEPENDENT_AMBULATORY_CARE_PROVIDER_SITE_OTHER): Payer: BC Managed Care – PPO | Admitting: Gastroenterology

## 2023-04-24 ENCOUNTER — Encounter: Payer: Self-pay | Admitting: Gastroenterology

## 2023-04-24 VITALS — BP 133/78 | HR 106 | Temp 97.8°F | Ht 67.5 in | Wt 201.4 lb

## 2023-04-24 DIAGNOSIS — K760 Fatty (change of) liver, not elsewhere classified: Secondary | ICD-10-CM | POA: Diagnosis not present

## 2023-04-24 DIAGNOSIS — K5909 Other constipation: Secondary | ICD-10-CM | POA: Diagnosis not present

## 2023-04-24 DIAGNOSIS — K640 First degree hemorrhoids: Secondary | ICD-10-CM | POA: Diagnosis not present

## 2023-04-24 DIAGNOSIS — D134 Benign neoplasm of liver: Secondary | ICD-10-CM

## 2023-04-24 NOTE — Progress Notes (Signed)
PROCEDURE NOTE: The patient presents with symptomatic grade 1 hemorrhoids, unresponsive to maximal medical therapy, requesting rubber band ligation of his/her hemorrhoidal disease.  All risks, benefits and alternative forms of therapy were described and informed consent was obtained.  The decision was made to band the RA internal hemorrhoid, and the [facility] System was used to perform band ligation without complication.  Digital anorectal examination was then performed to assure proper positioning of the band, and to adjust the banded tissue as required.  The patient was discharged home without pain or other issues.  Dietary and behavioral recommendations were given and (if necessary - prescriptions were given), along with follow-up instructions.  The patient will return 4 weeks for follow-up and possible additional banding as required.  No complications were encountered and the patient tolerated the procedure well.

## 2023-04-24 NOTE — Progress Notes (Signed)
Arlyss Repress, MD [address]  [address]  [place], [place] [address]  Main: [phone]  Fax: [phone]    Gastroenterology Consultation  Referring Provider:     Mort Sawyers, FNP Primary Care Physician:  Mort Sawyers, FNP Primary Gastroenterologist:  Dr. Arlyss Repress Reason for Consultation: Fatty liver, chronic constipation, grade 1 hemorrhoids, rectal bleeding        HPI:   Myia Kegel is a 57 y.o. female referred by Mort Sawyers, FNP  for consultation & management of elevated ALT since 2019, less than 2 times the upper limit of normal.  Rest of the liver enzymes have been normal.  She does have hypertriglyceridemia and prediabetes.  She underwent right upper quadrant ultrasound in 05/2022 which revealed hepatic steatosis, 1.5 cm indeterminate hypoechoic mass within the left hepatic lobe and an additional small mass.  Subsequently, underwent MRI abdomen with contrast, which revealed severe diffuse hepatic steatosis, for liver lesions measuring from 6 mm to 11 mm in size, with primary differential including focal nodular hyperplasia and hepatic adenomas.  Follow-up MRI in 6 months with contrast has been recommended.  Patient is referred to GI for further evaluation.  Patient has hepatic steatosis based on the CT scan in 04/2017  Most recent labs from 3/24 revealed elevated triglycerides, started on Zetia as well as atorvastatin 80 mg daily, LFTs were normal including ALT.  HbA1c 6.2.  Patient has gained a significant amount of weight over the last few years.  She does admit to eating high carbohydrate foods.  She has severe constipation has bright red blood per rectum associated with abdominal bloating.  Her stools are generally hard, describes on Bristol stool scale as 1 or 2 and sometimes overflow diarrhea.  She tried fiber which resulted in severe cramps.  She is taking over-the-counter stool softener.  Has not tried MiraLAX.  Drinks alcohol daily up to 15 drinks per  week Does smoke cigarettes She denies any family history of GI malignancy or chronic liver condition  Follow-up visit 01/16/2023 Ms. Broadbent is here to discuss about hemorrhoid banding.  She has been experiencing symptoms of rectal bleeding, pain, itching and discomfort.  She reports that her constipation is improving.  Follow-up visit 04/24/2023 Ms. Milich is here for hemorrhoid banding.  Since her first banding of right posterior column, her rectal bleeding has resolved and her symptoms have significantly improved.  She does report feeling constipated.  She is requesting follow-up MRI of the liver for follow-up of liver lesions  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, excised 04/30/2018   Vulvar intraepithelial neoplasia (VIN) grade 3     Past Surgical History:  Procedure Laterality Date   COLONOSCOPY WITH PROPOFOL N/A 01/02/2023   Procedure: COLONOSCOPY WITH PROPOFOL;  Surgeon: Toney Reil, MD;  Location: [facility];  Service: Gastroenterology;  Laterality: N/A;   INCISE AND DRAIN ABCESS  08/2016   spider  bite (right index finger)   INCISION AND DRAINAGE ABSCESS N/A 04/28/2017   Procedure: INCISION AND DRAINAGE ABSCESS of Vulva;  Surgeon: Tilda Burrow, MD;  Location: [facility];  Service: Gynecology;  Laterality: N/A;   INCISION AND DRAINAGE ABSCESS N/A 04/30/2017   Procedure: INCISION AND DRAINAGE of Vulvar ABSCESS;   Surgeon: Tilda Burrow, MD;  Location: [facility];  Service: Gynecology;  Laterality: N/A;   LAPAROSCOPIC ASSISTED VAGINAL HYSTERECTOMY  2007   LESION REMOVAL N/A 04/18/2017   Procedure: EXCISION VAGINAL LESION - Vulvar Mass;  Surgeon: Reva Bores, MD;  Location: [facility];  Service: Gynecology;  Laterality: N/A;     Current Outpatient Medications:    aspirin EC 81 MG tablet, Take 1 tablet (81 mg total) by mouth daily. Swallow whole., Disp: 90 tablet, Rfl: 3   atorvastatin (LIPITOR) 80 MG tablet, TAKE 1 TABLET BY MOUTH EVERY DAY, Disp: 90 tablet, Rfl: 3   clindamycin (CLEOCIN T) 1 % lotion, Apply topically., Disp: , Rfl:    diphenhydrAMINE (BENADRYL) 50 MG capsule, Take 50 mg by mouth as needed for sleep., Disp: , Rfl:    escitalopram (LEXAPRO) 10 MG tablet, TAKE 1 TABLET BY MOUTH EVERY DAY, Disp: 90 tablet, Rfl: 1   esomeprazole (NEXIUM) 20 MG packet, Take 20 mg by mouth daily before breakfast., Disp: , Rfl:    ezetimibe (ZETIA) 10 MG tablet, Take 1 tablet (10 mg total) by mouth daily., Disp: 90 tablet, Rfl: 3   fenofibrate (TRICOR) 145 MG tablet, Take 145 mg by mouth daily., Disp: , Rfl:    fluorouracil (EFUDEX) 5 % cream, Apply 1 Application topically 2 (two) times daily., Disp: , Rfl:    lisinopril-hydrochlorothiazide (ZESTORETIC) 20-12.5 MG tablet, TAKE 1 TABLET BY MOUTH EVERY DAY, Disp: 90 tablet, Rfl: 3   metoprolol succinate (TOPROL-XL) 25 MG 24 hr tablet, TAKE 1 TABLET (25 MG TOTAL) BY MOUTH DAILY., Disp: 90 tablet, Rfl: 3   Multiple Vitamins-Minerals (CENTRUM ADULTS PO), , Disp: , Rfl:    Omega-3 Fatty Acids (FISH OIL) 1200 MG CAPS, , Disp: , Rfl:    Family History  Problem Relation Age of Onset   Diabetes Father    Kidney disease Father    Hypertension Paternal Grandfather    Colon cancer Neg Hx    Stomach cancer Neg Hx    Esophageal cancer Neg Hx      Social History   Tobacco Use   Smoking status: Every Day    Current packs/day: 0.25    Average  packs/day: 0.3 packs/day for 25.0 years (6.3 ttl pk-yrs)    Types: Cigarettes   Smokeless tobacco: Never   Tobacco comments:    D/w her about this, she states she occasionally considers quitting, has decreased where she smokes and how frequently.   Vaping Use   Vaping status: Never Used  Substance Use Topics   Alcohol use: Yes    Alcohol/week: 15.0 standard drinks of alcohol    Types: 10 Glasses of wine, 5 Shots of liquor per week    Comment: wine/bourbon 5 days a week   Drug use: No    Allergies as of 04/24/2023   (No Known Allergies)    Review of Systems:    All systems reviewed and negative except where noted in HPI.   Physical Exam:  BP 133/78 (BP Location: Left Arm, Patient Position: Sitting, Cuff Size: Normal)   Pulse (!) 106   Temp 97.8 F (36.6 C) (Oral)   Ht  5' 7.5" (1.715 m)   Wt 201 lb 6 oz (91.3 kg)   BMI 31.07 kg/m  No LMP recorded. Patient has had a hysterectomy.  General:   Alert,  Well-developed, well-nourished, pleasant and cooperative in NAD Head:  Normocephalic and atraumatic. Eyes:  Sclera clear, no icterus.   Conjunctiva pink. Ears:  Normal auditory acuity. Nose:  No deformity, discharge, or lesions. Mouth:  No deformity or lesions,oropharynx pink & moist. Neck:  Supple; no masses or thyromegaly. Lungs:  Respirations even and unlabored.  Clear throughout to auscultation.   No wheezes, crackles, or rhonchi. No acute distress. Heart:  Regular rate and rhythm; no murmurs, clicks, rubs, or gallops. Abdomen:  Normal bowel sounds. Soft, non-tender and non-distended without masses, hepatosplenomegaly or hernias noted.  No guarding or rebound tenderness.   Rectal: Nontender digital rectal exam, perianal skin tags Msk:  Symmetrical without gross deformities. Good, equal movement & strength bilaterally. Pulses:  Normal pulses noted. Extremities:  No clubbing or edema.  No cyanosis. Neurologic:  Alert and oriented x3;  grossly normal neurologically. Skin:   Intact without significant lesions or rashes. No jaundice. Psych:  Alert and cooperative. Normal mood and affect.  Imaging Studies: Reviewed  Assessment and Plan:   Rosaleen Lander is a 57 y.o. 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 based on MRI in 05/2022 If it is FNH, we do not routinely obtain surveillance imaging to monitor asymptomatic patients with FNH because of the very low risk of lesion growth or complications  Hepatic adenomas: There is no presence of abdominal pain, and lesion is less than 5 cm in size, recommended to obtain contrast-enhanced MRI in 6 months and then annually thereafter depending on further characterization of 6 months MRI scan, provided that the lesion is not growing (ie, >=20% increase in diameter) and is not >5 cm in size  Last MRI was in 05/2022, discussed with patient regarding repeat MRI, she would like to schedule it today  Diffuse hepatic steatosis in setting of metabolic syndrome HCV antibody negative [facility] FibroSure in 12/2022 showed F0, S2-S3, and 3 which indicates severe Elita Boone with moderate to severe steatosis Discussed in length regarding importance of lifestyle modification with following healthy eating and vigorous physical activity at least 30 minutes daily  Rectal bleeding, chronic constipation Discussed about high-fiber diet, fiber supplements Trial of Trulance, samples provided Discussed about outpatient hemorrhoid ligation, procedure, risks and benefits Consent obtained, proceed with hemorrhoid ligation today   Follow up in 2-3 weeks   Arlyss Repress, MD

## 2023-04-24 NOTE — Patient Instructions (Addendum)
Gave Trulance Samples take 1 pill daily in the morning. Please let us know how it works for you and we can call you in a prescription for you.  Got your MRI schedule for you on 05/01/2023 arrived at 2:30pm for 3:00pm scan at the medical Nothing to eat or drink starting at 11;00. Please if you need to reschedule please call [phone] option 3 and then option 2.

## 2023-05-01 ENCOUNTER — Ambulatory Visit
Admission: RE | Admit: 2023-05-01 | Discharge: 2023-05-01 | Disposition: A | Discharge status: Home / Self Care | Payer: BC Managed Care – PPO | Source: Ambulatory Visit | Attending: Gastroenterology | Admitting: Gastroenterology

## 2023-05-01 DIAGNOSIS — D134 Benign neoplasm of liver: Secondary | ICD-10-CM | POA: Diagnosis present

## 2023-05-01 MED ORDER — GADOXETATE DISODIUM 0.25 MMOL/ML IV SOLN
9.0000 mL | Freq: Once | INTRAVENOUS | Status: AC | PRN
  Administered 2023-05-01: 9 mL via INTRAVENOUS

## 2023-05-02 ENCOUNTER — Other Ambulatory Visit: Payer: BC Managed Care – PPO

## 2023-05-08 ENCOUNTER — Encounter: Payer: Self-pay | Admitting: Gastroenterology

## 2023-05-08 ENCOUNTER — Ambulatory Visit: Payer: BC Managed Care – PPO | Admitting: Gastroenterology

## 2023-05-08 VITALS — BP 137/74 | HR 85 | Temp 98.3°F | Ht 67.5 in | Wt 203.0 lb

## 2023-05-08 DIAGNOSIS — K640 First degree hemorrhoids: Secondary | ICD-10-CM | POA: Diagnosis not present

## 2023-05-08 DIAGNOSIS — D134 Benign neoplasm of liver: Secondary | ICD-10-CM | POA: Diagnosis not present

## 2023-05-08 DIAGNOSIS — K5909 Other constipation: Secondary | ICD-10-CM

## 2023-05-08 DIAGNOSIS — K760 Fatty (change of) liver, not elsewhere classified: Secondary | ICD-10-CM | POA: Diagnosis not present

## 2023-05-08 MED ORDER — TRULANCE 3 MG PO TABS: 1.0000 | ORAL_TABLET | Freq: Every day | ORAL | 5 refills | Status: AC

## 2023-05-08 NOTE — Progress Notes (Signed)
Arlyss Repress, MD [address]  [address]  [place], [place] [address]  Main: [phone]  Fax: [phone]    Gastroenterology Consultation  Referring Provider:     Mort Sawyers, FNP Primary Care Physician:  Mort Sawyers, FNP Primary Gastroenterologist:  Dr. Arlyss Repress Reason for Consultation: Fatty liver, chronic constipation, grade 1 hemorrhoids, rectal bleeding        HPI:   Beth Green is a 57 y.o. female referred by Mort Sawyers, FNP  for consultation & management of elevated ALT since 2019, less than 2 times the upper limit of normal.  Rest of the liver enzymes have been normal.  She does have hypertriglyceridemia and prediabetes.  She underwent right upper quadrant ultrasound in 05/2022 which revealed hepatic steatosis, 1.5 cm indeterminate hypoechoic mass within the left hepatic lobe and an additional small mass.  Subsequently, underwent MRI abdomen with contrast, which revealed severe diffuse hepatic steatosis, for liver lesions measuring from 6 mm to 11 mm in size, with primary differential including focal nodular hyperplasia and hepatic adenomas.  Follow-up MRI in 6 months with contrast has been recommended.  Patient is referred to GI for further evaluation.  Patient has hepatic steatosis based on the CT scan in 04/2017  Most recent labs from 3/24 revealed elevated triglycerides, started on Zetia as well as atorvastatin 80 mg daily, LFTs were normal including ALT.  HbA1c 6.2.  Patient has gained a significant amount of weight over the last few years.  She does admit to eating high carbohydrate foods.  She has severe constipation has bright red blood per rectum associated with abdominal bloating.  Her stools are generally hard, describes on Bristol stool scale as 1 or 2 and sometimes overflow diarrhea.  She tried fiber which resulted in severe cramps.  She is taking over-the-counter stool softener.  Has not tried MiraLAX.  Drinks alcohol daily up to 15 drinks per  week Does smoke cigarettes She denies any family history of GI malignancy or chronic liver condition  Follow-up visit 01/16/2023 [name] is here to discuss about hemorrhoid banding.  She has been experiencing symptoms of rectal bleeding, pain, itching and discomfort.  She reports that her constipation is improving.  Follow-up visit 04/24/2023 [name] is here for hemorrhoid banding.  Since her first banding of right posterior column, her rectal bleeding has resolved and her symptoms have significantly improved.  She does report feeling constipated.  She is requesting follow-up MRI of the liver for follow-up of liver lesions  Follow-up visit 05/08/23 [name] is here for hemorrhoid banding.  She reports continued improvement in her hemorrhoidal symptoms.  She tried Trulance samples which helped with bloating, bowel frequency and consistency.  She also underwent MRI of the liver.   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, excised 04/30/2018   Vulvar intraepithelial neoplasia (VIN) grade 3     Past Surgical History:  Procedure Laterality Date   COLONOSCOPY WITH PROPOFOL N/A 01/02/2023  Procedure: COLONOSCOPY WITH PROPOFOL;  Surgeon: Toney Reil, MD;  Location: [facility];  Service: Gastroenterology;  Laterality: N/A;   INCISE AND DRAIN ABCESS  08/2016   spider bite (right index finger)   INCISION AND DRAINAGE ABSCESS N/A 04/28/2017   Procedure: INCISION AND DRAINAGE ABSCESS of Vulva;   Surgeon: Tilda Burrow, MD;  Location: [facility];  Service: Gynecology;  Laterality: N/A;   INCISION AND DRAINAGE ABSCESS N/A 04/30/2017   Procedure: INCISION AND DRAINAGE of Vulvar ABSCESS;  Surgeon: Tilda Burrow, MD;  Location: [facility];  Service: Gynecology;  Laterality: N/A;   LAPAROSCOPIC ASSISTED VAGINAL HYSTERECTOMY  2007   LESION REMOVAL N/A 04/18/2017   Procedure: EXCISION VAGINAL LESION - Vulvar Mass;  Surgeon: Reva Bores, MD;  Location: [facility];  Service: Gynecology;  Laterality: N/A;     Current Outpatient Medications:    aspirin EC 81 MG tablet, Take 1 tablet (81 mg total) by mouth daily. Swallow whole., Disp: 90 tablet, Rfl: 3   atorvastatin (LIPITOR) 80 MG tablet, TAKE 1 TABLET BY MOUTH EVERY DAY, Disp: 90 tablet, Rfl: 3   clindamycin (CLEOCIN T) 1 % lotion, Apply topically., Disp: , Rfl:    diphenhydrAMINE (BENADRYL) 50 MG capsule, Take 50 mg by mouth as needed for sleep., Disp: , Rfl:    escitalopram (LEXAPRO) 10 MG tablet, TAKE 1 TABLET BY MOUTH EVERY DAY, Disp: 90 tablet, Rfl: 1   esomeprazole (NEXIUM) 20 MG packet, Take 20 mg by mouth daily before breakfast., Disp: , Rfl:    ezetimibe (ZETIA) 10 MG tablet, Take 1 tablet (10 mg total) by mouth daily., Disp: 90 tablet, Rfl: 3   fenofibrate (TRICOR) 145 MG tablet, Take 145 mg by mouth daily., Disp: , Rfl:    fluorouracil (EFUDEX) 5 % cream, Apply 1 Application topically 2 (two) times daily., Disp: , Rfl:    lisinopril-hydrochlorothiazide (ZESTORETIC) 20-12.5 MG tablet, TAKE 1 TABLET BY MOUTH EVERY DAY, Disp: 90 tablet, Rfl: 3   metoprolol succinate (TOPROL-XL) 25 MG 24 hr tablet, TAKE 1 TABLET (25 MG TOTAL) BY MOUTH DAILY., Disp: 90 tablet, Rfl: 3   Multiple Vitamins-Minerals (CENTRUM ADULTS PO), , Disp: , Rfl:    Omega-3 Fatty Acids (FISH OIL) 1200 MG CAPS, , Disp: , Rfl:    Plecanatide (TRULANCE) 3 MG TABS, Take 1 tablet (3 mg total) by mouth daily., Disp: 30 tablet, Rfl: 5   Family History   Problem Relation Age of Onset   Diabetes Father    Kidney disease Father    Hypertension Paternal Grandfather    Colon cancer Neg Hx    Stomach cancer Neg Hx    Esophageal cancer Neg Hx      Social History   Tobacco Use   Smoking status: Every Day    Current packs/day: 0.25    Average packs/day: 0.3 packs/day for 25.0 years (6.3 ttl pk-yrs)    Types: Cigarettes   Smokeless tobacco: Never   Tobacco comments:    D/w her about this, she states she occasionally considers quitting, has decreased where she smokes and how frequently.   Vaping Use   Vaping status: Never Used  Substance Use Topics   Alcohol use: Yes    Alcohol/week: 15.0 standard drinks of alcohol    Types: 10 Glasses of wine, 5 Shots of liquor per week    Comment: wine/bourbon 5 days a week   Drug use: No    Allergies as of 05/08/2023   (No Known Allergies)  Review of Systems:    All systems reviewed and negative except where noted in HPI.   Physical Exam:  BP 137/74 (BP Location: Left Arm, Patient Position: Sitting, Cuff Size: Normal)   Pulse 85   Temp 98.3 F (36.8 C) (Oral)   Ht 5' 7.5" (1.715 m)   Wt 203 lb (92.1 kg)   BMI 31.33 kg/m  No LMP recorded. Patient has had a hysterectomy.  General:   Alert,  Well-developed, well-nourished, pleasant and cooperative in NAD Head:  Normocephalic and atraumatic. Eyes:  Sclera clear, no icterus.   Conjunctiva pink. Ears:  Normal auditory acuity. Nose:  No deformity, discharge, or lesions. Mouth:  No deformity or lesions,oropharynx pink & moist. Neck:  Supple; no masses or thyromegaly. Lungs:  Respirations even and unlabored.  Clear throughout to auscultation.   No wheezes, crackles, or rhonchi. No acute distress. Heart:  Regular rate and rhythm; no murmurs, clicks, rubs, or gallops. Abdomen:  Normal bowel sounds. Soft, non-tender and non-distended without masses, hepatosplenomegaly or hernias noted.  No guarding or rebound tenderness.   Rectal: Nontender  digital rectal exam, perianal skin tags Msk:  Symmetrical without gross deformities. Good, equal movement & strength bilaterally. Pulses:  Normal pulses noted. Extremities:  No clubbing or edema.  No cyanosis. Neurologic:  Alert and oriented x3;  grossly normal neurologically. Skin:  Intact without significant lesions or rashes. No jaundice. Psych:  Alert and cooperative. Normal mood and affect.  Imaging Studies: Reviewed  Assessment and Plan:   Anjana Cheek is a 57 y.o. 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 based on MRI in 05/2022 If it is FNH, we do not routinely obtain surveillance imaging to monitor asymptomatic patients with FNH because of the very low risk of lesion growth or complications  Hepatic adenomas: There is no presence of abdominal pain, and lesion is less than 5 cm in size, recommended to obtain contrast-enhanced MRI in 6 months and then annually thereafter depending on further characterization of 6 months MRI scan, provided that the lesion is not growing (ie, >=20% increase in diameter) and is not >5 cm in size  Last MRI was in 05/2022, repeat MRI on 05/03/2023 revealed unchanged hepatic lesions. Recommend surveillance MRI in 04/2024  Diffuse hepatic steatosis in setting of metabolic syndrome HCV antibody negative [facility] FibroSure in 12/2022 showed F0, S2-S3, and N3 which indicates severe Elita Boone with moderate to severe steatosis Discussed in length regarding importance of lifestyle modification with following healthy eating and vigorous physical activity at least 30 minutes daily Repeat Nash fibrosis panel in 12/2023  Rectal bleeding, chronic constipation Discussed about high-fiber diet, fiber supplements Trulance samples improved symptoms associated with  constipation, abdominal bloating Will send in prescription  Grade 1 symptomatic hemorrhoids Discussed about outpatient hemorrhoid ligation, procedure, risks and benefits Consent obtained, proceed with hemorrhoid ligation today   Follow up in 6 months with PA-C, Lavonna Monarch, MD

## 2023-05-08 NOTE — Progress Notes (Signed)

## 2023-07-25 ENCOUNTER — Telehealth: Payer: BC Managed Care – PPO | Admitting: Physician Assistant

## 2023-07-25 DIAGNOSIS — J069 Acute upper respiratory infection, unspecified: Secondary | ICD-10-CM

## 2023-07-25 MED ORDER — PROMETHAZINE-DM 6.25-15 MG/5ML PO SYRP
5.0000 mL | ORAL_SOLUTION | Freq: Every evening | ORAL | 0 refills | Status: DC | PRN
Start: 2023-07-25 — End: 2023-08-01

## 2023-07-25 MED ORDER — ALBUTEROL SULFATE HFA 108 (90 BASE) MCG/ACT IN AERS
2.0000 | INHALATION_SPRAY | Freq: Four times a day (QID) | RESPIRATORY_TRACT | 1 refills | Status: DC | PRN
Start: 2023-07-25 — End: 2023-10-15

## 2023-07-25 MED ORDER — METHYLPREDNISOLONE 4 MG PO TBPK
ORAL_TABLET | ORAL | 0 refills | Status: DC
Start: 2023-07-25 — End: 2023-08-01

## 2023-07-25 NOTE — Progress Notes (Signed)
Virtual Visit Consent   Beth Green, you are scheduled for a virtual visit with a [facility] provider today. Just as with appointments in the office, your consent must be obtained to participate. Your consent will be active for this visit and any virtual visit you may have with one of our providers in the next 365 days. If you have a MyChart account, a copy of this consent can be sent to you electronically.  As this is a virtual visit, video technology does not allow for your provider to perform a traditional examination. This may limit your provider's ability to fully assess your condition. If your provider identifies any concerns that need to be evaluated in person or the need to arrange testing (such as labs, EKG, etc.), we will make arrangements to do so. Although advances in technology are sophisticated, we cannot ensure that it will always work on either your end or our end. If the connection with a video visit is poor, the visit may have to be switched to a telephone visit. With either a video or telephone visit, we are not always able to ensure that we have a secure connection.  By engaging in this virtual visit, you consent to the provision of healthcare and authorize for your insurance to be billed (if applicable) for the services provided during this visit. Depending on your insurance coverage, you may receive a charge related to this service.  I need to obtain your verbal consent now. Are you willing to proceed with your visit today? Beth Green has provided verbal consent on 07/25/2023 for a virtual visit (video or telephone). Beth Green, [place]  Date: 07/25/2023 8:54 AM  Virtual Visit via Video Note   I, Beth Green, connected with  Beth Green  ([id number], 1966/07/06) on 07/25/23 at  9:00 AM EST by a video-enabled telemedicine application and verified that I am speaking with the correct person using two identifiers.  Location: Patient: Virtual Visit Location Patient:  Home Provider: Virtual Visit Location Provider: Home Office   I discussed the limitations of evaluation and management by telemedicine and the availability of in person appointments. The patient expressed understanding and agreed to proceed.    History of Present Illness: Beth Green is a 57 y.o. who identifies as a female who was assigned female at birth, and is being seen today for URI symptoms with worsening cough.  HPI: 58 y/o F presents via video telehealth visit c/o cough and congestion x3 days. Worse cough during the night and unable to sleep for the past 3 days. She has been taking otc Robitussin, Mucinex and other multi-symptom cold medicines without much extended relief. Pt has h/o HTN unable to take Sudafed. + smoker. No h/o asthma.   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   Irregular heart beat 04/25/2022   PAC (premature atrial contraction) 04/25/2022   Vitamin D deficiency 08/31/2021   Hyperglycemia 08/31/2021   HLD (hyperlipidemia) 04/29/2019   Tobacco abuse 02/15/2017   Generalized anxiety disorder 02/15/2017   Benign essential hypertension 02/15/2017   Hidradenitis suppurativa    GERD (gastroesophageal reflux disease)     Allergies: No Known Allergies Medications:  Current Outpatient Medications:    albuterol (VENTOLIN HFA) 108 (90 Base) MCG/ACT inhaler, Inhale 2 puffs into the lungs every 6 (six) hours as needed for wheezing or shortness of breath., Disp: 8  g, Rfl: 1   methylPREDNISolone (MEDROL DOSEPAK) 4 MG TBPK tablet, Take as directed, Disp: 1 each, Rfl: 0   promethazine-dextromethorphan (PROMETHAZINE-DM) 6.25-15 MG/5ML syrup, Take 5 mLs by mouth at bedtime as needed for cough., Disp: 118 mL, Rfl: 0   aspirin EC 81 MG tablet, Take 1 tablet (81 mg total) by mouth daily. Swallow whole., Disp: 90 tablet, Rfl: 3   atorvastatin (LIPITOR) 80  MG tablet, TAKE 1 TABLET BY MOUTH EVERY DAY, Disp: 90 tablet, Rfl: 3   clindamycin (CLEOCIN T) 1 % lotion, Apply topically., Disp: , Rfl:    diphenhydrAMINE (BENADRYL) 50 MG capsule, Take 50 mg by mouth as needed for sleep., Disp: , Rfl:    escitalopram (LEXAPRO) 10 MG tablet, TAKE 1 TABLET BY MOUTH EVERY DAY, Disp: 90 tablet, Rfl: 1   esomeprazole (NEXIUM) 20 MG packet, Take 20 mg by mouth daily before breakfast., Disp: , Rfl:    ezetimibe (ZETIA) 10 MG tablet, Take 1 tablet (10 mg total) by mouth daily., Disp: 90 tablet, Rfl: 3   fenofibrate (TRICOR) 145 MG tablet, Take 145 mg by mouth daily., Disp: , Rfl:    fluorouracil (EFUDEX) 5 % cream, Apply 1 Application topically 2 (two) times daily., Disp: , Rfl:    lisinopril-hydrochlorothiazide (ZESTORETIC) 20-12.5 MG tablet, TAKE 1 TABLET BY MOUTH EVERY DAY, Disp: 90 tablet, Rfl: 3   metoprolol succinate (TOPROL-XL) 25 MG 24 hr tablet, TAKE 1 TABLET (25 MG TOTAL) BY MOUTH DAILY., Disp: 90 tablet, Rfl: 3   Multiple Vitamins-Minerals (CENTRUM ADULTS PO), , Disp: , Rfl:    Omega-3 Fatty Acids (FISH OIL) 1200 MG CAPS, , Disp: , Rfl:    Plecanatide (TRULANCE) 3 MG TABS, Take 1 tablet (3 mg total) by mouth daily., Disp: 30 tablet, Rfl: 5  Observations/Objective: Patient is well-developed, well-nourished in no acute distress.  Resting comfortably  at home.  Head is normocephalic, atraumatic.  No labored breathing.  Speech is clear and coherent with logical content.  Patient is alert and oriented at baseline.    Assessment and Plan: 1. URI with cough and congestion (Primary) - methylPREDNISolone (MEDROL DOSEPAK) 4 MG TBPK tablet; Take as directed  Dispense: 1 each; Refill: 0 - albuterol (VENTOLIN HFA) 108 (90 Base) MCG/ACT inhaler; Inhale 2 puffs into the lungs every 6 (six) hours as needed for wheezing or shortness of breath.  Dispense: 8 g; Refill: 1 - promethazine-dextromethorphan (PROMETHAZINE-DM) 6.25-15 MG/5ML syrup; Take 5 mLs by mouth at  bedtime as needed for cough.  Dispense: 118 mL; Refill: 0  Increase fluids Start a humidifier Start medicines as prescribed. Continue to watch for worsening symptoms.  Follow Up Instructions: I discussed the assessment and treatment plan with the patient. The patient was provided an opportunity to ask questions and all were answered. The patient agreed with the plan and demonstrated an understanding of the instructions.  A copy of instructions were sent to the patient via MyChart unless otherwise noted below.   Patient has requested to receive PHI (AVS, Work Notes, etc) pertaining to this video visit through e-mail as they are currently without active MyChart. They have voiced understand that email is not considered secure and their health information could be viewed by someone other than the patient.   The patient was advised to call back or seek an in-person evaluation if the symptoms worsen or if the condition fails to improve as anticipated.    Beth Better, PA-C

## 2023-07-25 NOTE — Patient Instructions (Signed)
[name], thank you for joining Gilberto Better, PA-C for today's virtual visit.  While this provider is not your primary care provider (PCP), if your PCP is located in our provider database this encounter information will be shared with them immediately following your visit.   A [facility] MyChart account gives you access to today's visit and all your visits, tests, and labs performed at [facility] " click here if you don't have a [facility] MyChart account or go to [email or web address]  Consent: (Patient) Beth Green provided verbal consent for this virtual visit at the beginning of the encounter.  Current Medications:  Current Outpatient Medications:    albuterol (VENTOLIN HFA) 108 (90 Base) MCG/ACT inhaler, Inhale 2 puffs into the lungs every 6 (six) hours as needed for wheezing or shortness of breath., Disp: 8 g, Rfl: 1   methylPREDNISolone (MEDROL DOSEPAK) 4 MG TBPK tablet, Take as directed, Disp: 1 each, Rfl: 0   promethazine-dextromethorphan (PROMETHAZINE-DM) 6.25-15 MG/5ML syrup, Take 5 mLs by mouth at bedtime as needed for cough., Disp: 118 mL, Rfl: 0   aspirin EC 81 MG tablet, Take 1 tablet (81 mg total) by mouth daily. Swallow whole., Disp: 90 tablet, Rfl: 3   atorvastatin (LIPITOR) 80 MG tablet, TAKE 1 TABLET BY MOUTH EVERY DAY, Disp: 90 tablet, Rfl: 3   clindamycin (CLEOCIN T) 1 % lotion, Apply topically., Disp: , Rfl:    diphenhydrAMINE (BENADRYL) 50 MG capsule, Take 50 mg by mouth as needed for sleep., Disp: , Rfl:    escitalopram (LEXAPRO) 10 MG tablet, TAKE 1 TABLET BY MOUTH EVERY DAY, Disp: 90 tablet, Rfl: 1   esomeprazole (NEXIUM) 20 MG packet, Take 20 mg by mouth daily before breakfast., Disp: , Rfl:    ezetimibe (ZETIA) 10 MG tablet, Take 1 tablet (10 mg total) by mouth daily., Disp: 90 tablet, Rfl: 3   fenofibrate (TRICOR) 145 MG tablet, Take 145 mg by mouth daily., Disp: , Rfl:    fluorouracil (EFUDEX) 5 % cream, Apply 1 Application topically 2  (two) times daily., Disp: , Rfl:    lisinopril-hydrochlorothiazide (ZESTORETIC) 20-12.5 MG tablet, TAKE 1 TABLET BY MOUTH EVERY DAY, Disp: 90 tablet, Rfl: 3   metoprolol succinate (TOPROL-XL) 25 MG 24 hr tablet, TAKE 1 TABLET (25 MG TOTAL) BY MOUTH DAILY., Disp: 90 tablet, Rfl: 3   Multiple Vitamins-Minerals (CENTRUM ADULTS PO), , Disp: , Rfl:    Omega-3 Fatty Acids (FISH OIL) 1200 MG CAPS, , Disp: , Rfl:    Plecanatide (TRULANCE) 3 MG TABS, Take 1 tablet (3 mg total) by mouth daily., Disp: 30 tablet, Rfl: 5   Medications ordered in this encounter:  Meds ordered this encounter  Medications   methylPREDNISolone (MEDROL DOSEPAK) 4 MG TBPK tablet    Sig: Take as directed    Dispense:  1 each    Refill:  0    Supervising Provider:   Merrilee Jansky [[id number]]   albuterol (VENTOLIN HFA) 108 (90 Base) MCG/ACT inhaler    Sig: Inhale 2 puffs into the lungs every 6 (six) hours as needed for wheezing or shortness of breath.    Dispense:  8 g    Refill:  1    Supervising Provider:   Merrilee Jansky [[id number]]   promethazine-dextromethorphan (PROMETHAZINE-DM) 6.25-15 MG/5ML syrup    Sig: Take 5 mLs by mouth at bedtime as needed for cough.    Dispense:  118 mL    Refill:  0    Supervising Provider:   Leonides Grills,  Britta Mccreedy [[id number]]     *If you need refills on other medications prior to your next appointment, please contact your pharmacy*  Follow-Up: Call back or seek an in-person evaluation if the symptoms worsen or if the condition fails to improve as anticipated.  [facility] [phone]  Other Instructions Increase fluids Start a humidifier Start medicines as prescribed.   If you have been instructed to have an in-person evaluation today at a local Urgent Care facility, please use the link below. It will take you to a list of all of our available [facility], including address, phone number and hours of operation. Please do not delay care.  [facility]  If you or a family member do not have a primary care provider, use the link below to schedule a visit and establish care. When you choose a [facility] primary care physician or advanced practice provider, you gain a long-term partner in health. Find a Primary Care Provider  Learn more about [facility]'s in-office and virtual care options:  - Get Care Now

## 2023-08-01 ENCOUNTER — Encounter: Payer: Self-pay | Admitting: Family

## 2023-08-01 ENCOUNTER — Ambulatory Visit (INDEPENDENT_AMBULATORY_CARE_PROVIDER_SITE_OTHER): Payer: BC Managed Care – PPO | Admitting: Family Medicine

## 2023-08-01 VITALS — BP 112/70 | HR 75 | Temp 97.7°F | Ht 67.5 in | Wt 199.5 lb

## 2023-08-01 DIAGNOSIS — F17200 Nicotine dependence, unspecified, uncomplicated: Secondary | ICD-10-CM

## 2023-08-01 DIAGNOSIS — J208 Acute bronchitis due to other specified organisms: Secondary | ICD-10-CM

## 2023-08-01 MED ORDER — HYDROCODONE BIT-HOMATROP MBR 5-1.5 MG/5ML PO SOLN: 5.0000 mL | Freq: Three times a day (TID) | ORAL | 0 refills | Status: DC | PRN

## 2023-08-01 MED ORDER — PREDNISONE 20 MG PO TABS
40.0000 mg | ORAL_TABLET | Freq: Every day | ORAL | 0 refills | Status: DC
Start: 2023-08-01 — End: 2023-10-15

## 2023-08-01 MED ORDER — DOXYCYCLINE HYCLATE 100 MG PO TABS
100.0000 mg | ORAL_TABLET | Freq: Two times a day (BID) | ORAL | 0 refills | Status: DC
Start: 2023-08-01 — End: 2023-10-15

## 2023-08-01 NOTE — Progress Notes (Signed)
Beth Green T. Lennyn Gange, MD, CAQ Sports Medicine [facility] at [facility] [address] [place], [address]  Phone: [phone]  FAX: [phone]  Beth Green - 57 y.o. female  MRN [id number]  Date of Birth: 1965-10-19  Date: 08/01/2023  PCP: Mort Sawyers, FNP  Referral: Mort Sawyers, FNP  Chief Complaint  Patient presents with   Cough   Laryngitis    E-Visit 12/19/20214   Subjective:   Beth Green is a 57 y.o. very pleasant female patient with Body mass index is 30.78 kg/m. who presents with the following:  Beth Green is a pleasant generally healthy 57 year old lady who presents with an acute visit and follow-up after an ED visit from July 25, 2023.  She was diagnosed with a URI at that time, and she was given a Medrol Dosepak, albuterol inhaler and Promethazine DM.  She does have a history of smoking, but she denies any known history of pulmonary disease including no history of asthma or COPD.  At this point, she has been sick for more than 10 days.  She does have a very significant coughing and struggling mildly to breathe with some shortness of breath.  She does not have a fever, she does still have a sore throat, however that has been improving.  She is hurting some in the chest and in the abs with taking a deep breath and coughing.   Review of Systems is noted in the HPI, as appropriate  Objective:   BP 112/70 (BP Location: Left Arm, Patient Position: Sitting, Cuff Size: Large)   Pulse 75   Temp 97.7 F (36.5 C) (Temporal)   Ht 5' 7.5" (1.715 m)   Wt 199 lb 8 oz (90.5 kg)   SpO2 93%   BMI 30.78 kg/m    Gen: WDWN, NAD. Globally Non-toxic HEENT: Throat clear, w/o exudate, R TM clear, L TM - good landmarks, No fluid present. rhinnorhea.  MMM Frontal sinuses: NT Max sinuses: NT NECK: Anterior cervical  LAD is absent CV: RRR, No M/G/R, cap refill <2 sec PULM: Breathing comfortably in no respiratory distress.  She does have scattered  bilateral rhonchorous sounds without wheezing   Laboratory and Imaging Data:  Assessment and Plan:     ICD-10-CM   1. Acute bronchitis due to other specified organisms  J20.8     2. Smoker  F17.200      Severe cough greater than 10 days with some rhonchorous sounds on exam.  I am going to cover her for possible atypical pneumonia.  Will also place her on some steroids and give her some strong cough medicine to use at night.  Medication Management during today's office visit: Meds ordered this encounter  Medications   doxycycline (VIBRA-TABS) 100 MG tablet    Sig: Take 1 tablet (100 mg total) by mouth 2 (two) times daily.    Dispense:  20 tablet    Refill:  0   predniSONE (DELTASONE) 20 MG tablet    Sig: Take 2 tablets (40 mg total) by mouth daily.    Dispense:  14 tablet    Refill:  0   HYDROcodone bit-homatropine (HYCODAN) 5-1.5 MG/5ML syrup    Sig: Take 5 mLs by mouth every 8 (eight) hours as needed for cough.    Dispense:  120 mL    Refill:  0   Medications Discontinued During This Encounter  Medication Reason   methylPREDNISolone (MEDROL DOSEPAK) 4 MG TBPK tablet Completed Course   promethazine-dextromethorphan (PROMETHAZINE-DM) 6.25-15  MG/5ML syrup Completed Course    Orders placed today for conditions managed today: No orders of the defined types were placed in this encounter.   Disposition: No follow-ups on file.  [employer] speech-to-text software was used for transcription in this dictation.  Possible transcriptional errors can occur using Animal nutritionist.   Signed,  Elpidio Galea. Jiro Kiester, MD   Outpatient Encounter Medications as of 08/01/2023  Medication Sig   albuterol (VENTOLIN HFA) 108 (90 Base) MCG/ACT inhaler Inhale 2 puffs into the lungs every 6 (six) hours as needed for wheezing or shortness of breath.   aspirin EC 81 MG tablet Take 1 tablet (81 mg total) by mouth daily. Swallow whole.   atorvastatin (LIPITOR) 80 MG tablet TAKE 1 TABLET BY  MOUTH EVERY DAY   clindamycin (CLEOCIN T) 1 % lotion Apply topically.   diphenhydrAMINE (BENADRYL) 50 MG capsule Take 50 mg by mouth as needed for sleep.   doxycycline (VIBRA-TABS) 100 MG tablet Take 1 tablet (100 mg total) by mouth 2 (two) times daily.   escitalopram (LEXAPRO) 10 MG tablet TAKE 1 TABLET BY MOUTH EVERY DAY   esomeprazole (NEXIUM) 20 MG packet Take 20 mg by mouth daily before breakfast.   ezetimibe (ZETIA) 10 MG tablet Take 1 tablet (10 mg total) by mouth daily.   fenofibrate (TRICOR) 145 MG tablet Take 145 mg by mouth daily.   fluorouracil (EFUDEX) 5 % cream Apply 1 Application topically 2 (two) times daily.   HYDROcodone bit-homatropine (HYCODAN) 5-1.5 MG/5ML syrup Take 5 mLs by mouth every 8 (eight) hours as needed for cough.   lisinopril-hydrochlorothiazide (ZESTORETIC) 20-12.5 MG tablet TAKE 1 TABLET BY MOUTH EVERY DAY   metoprolol succinate (TOPROL-XL) 25 MG 24 hr tablet TAKE 1 TABLET (25 MG TOTAL) BY MOUTH DAILY.   Multiple Vitamins-Minerals (CENTRUM ADULTS PO)    Omega-3 Fatty Acids (FISH OIL) 1200 MG CAPS    Plecanatide (TRULANCE) 3 MG TABS Take 1 tablet (3 mg total) by mouth daily.   predniSONE (DELTASONE) 20 MG tablet Take 2 tablets (40 mg total) by mouth daily.   [DISCONTINUED] methylPREDNISolone (MEDROL DOSEPAK) 4 MG TBPK tablet Take as directed   [DISCONTINUED] promethazine-dextromethorphan (PROMETHAZINE-DM) 6.25-15 MG/5ML syrup Take 5 mLs by mouth at bedtime as needed for cough.   No facility-administered encounter medications on file as of 08/01/2023.

## 2023-08-03 ENCOUNTER — Other Ambulatory Visit: Payer: Self-pay | Admitting: Family

## 2023-08-03 DIAGNOSIS — F411 Generalized anxiety disorder: Secondary | ICD-10-CM

## 2023-09-16 ENCOUNTER — Encounter: Payer: BC Managed Care – PPO | Admitting: Family

## 2023-10-06 ENCOUNTER — Other Ambulatory Visit: Payer: Self-pay | Admitting: Family

## 2023-10-06 DIAGNOSIS — E782 Mixed hyperlipidemia: Secondary | ICD-10-CM

## 2023-10-06 DIAGNOSIS — I491 Atrial premature depolarization: Secondary | ICD-10-CM

## 2023-10-06 DIAGNOSIS — I499 Cardiac arrhythmia, unspecified: Secondary | ICD-10-CM

## 2023-10-11 ENCOUNTER — Other Ambulatory Visit: Payer: Self-pay | Admitting: *Deleted

## 2023-10-11 DIAGNOSIS — Z1231 Encounter for screening mammogram for malignant neoplasm of breast: Secondary | ICD-10-CM

## 2023-10-15 ENCOUNTER — Encounter: Payer: Self-pay | Admitting: Family

## 2023-10-15 ENCOUNTER — Ambulatory Visit: Payer: BC Managed Care – PPO | Admitting: Family

## 2023-10-15 VITALS — BP 124/84 | HR 87 | Temp 98.0°F | Ht 67.5 in | Wt 202.4 lb

## 2023-10-15 DIAGNOSIS — Z720 Tobacco use: Secondary | ICD-10-CM

## 2023-10-15 DIAGNOSIS — Z0000 Encounter for general adult medical examination without abnormal findings: Secondary | ICD-10-CM | POA: Diagnosis not present

## 2023-10-15 DIAGNOSIS — I10 Essential (primary) hypertension: Secondary | ICD-10-CM | POA: Diagnosis not present

## 2023-10-15 DIAGNOSIS — F411 Generalized anxiety disorder: Secondary | ICD-10-CM

## 2023-10-15 DIAGNOSIS — K582 Mixed irritable bowel syndrome: Secondary | ICD-10-CM

## 2023-10-15 DIAGNOSIS — E559 Vitamin D deficiency, unspecified: Secondary | ICD-10-CM | POA: Diagnosis not present

## 2023-10-15 DIAGNOSIS — K219 Gastro-esophageal reflux disease without esophagitis: Secondary | ICD-10-CM

## 2023-10-15 DIAGNOSIS — E782 Mixed hyperlipidemia: Secondary | ICD-10-CM

## 2023-10-15 DIAGNOSIS — R7303 Prediabetes: Secondary | ICD-10-CM | POA: Diagnosis not present

## 2023-10-15 LAB — CBC
HCT: 43 % (ref 36.0–46.0)
Hemoglobin: 14.3 g/dL (ref 12.0–15.0)
MCHC: 33.3 g/dL (ref 30.0–36.0)
MCV: 90.3 fl (ref 78.0–100.0)
Platelets: 154 10*3/uL (ref 150.0–400.0)
RBC: 4.76 Mil/uL (ref 3.87–5.11)
RDW: 13.4 % (ref 11.5–15.5)
WBC: 7.3 10*3/uL (ref 4.0–10.5)

## 2023-10-15 LAB — MICROALBUMIN / CREATININE URINE RATIO
Creatinine,U: 42.7 mg/dL
Microalb Creat Ratio: 16.4 mg/g (ref 0.0–30.0)
Microalb, Ur: <0.7 mg/dL (ref 0.0–1.9)

## 2023-10-15 LAB — COMPREHENSIVE METABOLIC PANEL
ALT: 60 U/L — ABNORMAL HIGH (ref 0–35)
AST: 41 U/L — ABNORMAL HIGH (ref 0–37)
Albumin: 4.6 g/dL (ref 3.5–5.2)
Alkaline Phosphatase: 56 U/L (ref 39–117)
BUN: 13 mg/dL (ref 6–23)
CO2: 32 meq/L (ref 19–32)
Calcium: 10 mg/dL (ref 8.4–10.5)
Chloride: 101 meq/L (ref 96–112)
Creatinine, Ser: 0.65 mg/dL (ref 0.40–1.20)
GFR: 97.32 mL/min (ref >60.00)
Glucose, Bld: 113 mg/dL — ABNORMAL HIGH (ref 70–99)
Potassium: 4.1 meq/L (ref 3.5–5.1)
Sodium: 140 meq/L (ref 135–145)
Total Bilirubin: 0.4 mg/dL (ref 0.2–1.2)
Total Protein: 6.9 g/dL (ref 6.0–8.3)

## 2023-10-15 LAB — LIPID PANEL
Cholesterol: 117 mg/dL (ref 0–200)
HDL: 48.2 mg/dL (ref >39.00)
LDL Cholesterol: 32 mg/dL (ref 0–99)
NonHDL: 68.46
Total CHOL/HDL Ratio: 2
Triglycerides: 183 mg/dL — ABNORMAL HIGH (ref 0.0–149.0)
VLDL: 36.6 mg/dL (ref 0.0–40.0)

## 2023-10-15 LAB — HEMOGLOBIN A1C: Hgb A1c MFr Bld: 6.3 % (ref 4.6–6.5)

## 2023-10-15 NOTE — Progress Notes (Signed)
 Subjective:  Patient ID: Beth Green, female    DOB: 1966/05/26  Age: 58 y.o. MRN: [id number]  Patient Care Team: Mort Sawyers, FNP as PCP - General (Family Medicine) Debbe Odea, MD as PCP - Cardiology (Cardiology)   CC:  Chief Complaint  Patient presents with   Annual Exam    HPI Beth Green is a 58 y.o. female who presents today for an annual physical exam. She reports consuming a general diet. The patient does not participate in regular exercise at present. She generally feels well. She reports sleeping fairly well. She does not have additional problems to discuss today.   Vision:Within last year Dental:Receives regular dental care  Lung Cancer Screening with low-dose Chest CT: pt declines   Mammogram: scheduled for 11/11/23 Colonoscopy: 01/02/23 Pneumonia, is a candidate but likely insurance not to cover yet.   Pt is without acute concerns.   Still with ongoing IBS D,C symptoms affecting daily life. Was considering ginger root daily to help with her stomach.   Advanced Directives Patient does not have advanced directives   DEPRESSION SCREENING    10/15/2023    7:39 AM 08/31/2021   10:16 AM 08/02/2020   10:18 AM 04/28/2019    4:05 PM 04/22/2018    9:53 AM 03/19/2017   10:47 AM  PHQ 2/9 Scores  PHQ - 2 Score 0 0 0 0 0 0  PHQ- 9 Score 0   0 0      ROS: Negative unless specifically indicated above in HPI.    Current Outpatient Medications:    aspirin EC 81 MG tablet, Take 1 tablet (81 mg total) by mouth daily. Swallow whole., Disp: 90 tablet, Rfl: 3   atorvastatin (LIPITOR) 80 MG tablet, TAKE 1 TABLET BY MOUTH EVERY DAY, Disp: 90 tablet, Rfl: 3   clindamycin (CLEOCIN T) 1 % lotion, Apply topically., Disp: , Rfl:    diphenhydrAMINE (BENADRYL) 50 MG capsule, Take 50 mg by mouth as needed for sleep., Disp: , Rfl:    escitalopram (LEXAPRO) 10 MG tablet, TAKE 1 TABLET BY MOUTH EVERY DAY, Disp: 90 tablet, Rfl: 1   esomeprazole (NEXIUM) 20 MG packet, Take 20 mg  by mouth daily before breakfast., Disp: , Rfl:    ezetimibe (ZETIA) 10 MG tablet, TAKE 1 TABLET BY MOUTH EVERY DAY, Disp: 90 tablet, Rfl: 1   fenofibrate (TRICOR) 145 MG tablet, TAKE 1 TABLET BY MOUTH EVERY DAY, Disp: 90 tablet, Rfl: 1   fluorouracil (EFUDEX) 5 % cream, Apply 1 Application topically 2 (two) times daily., Disp: , Rfl:    lisinopril-hydrochlorothiazide (ZESTORETIC) 20-12.5 MG tablet, TAKE 1 TABLET BY MOUTH EVERY DAY, Disp: 90 tablet, Rfl: 3   metoprolol succinate (TOPROL-XL) 25 MG 24 hr tablet, TAKE 1 TABLET (25 MG TOTAL) BY MOUTH DAILY., Disp: 90 tablet, Rfl: 1   Multiple Vitamins-Minerals (CENTRUM ADULTS PO), , Disp: , Rfl:    Omega-3 Fatty Acids (FISH OIL) 1200 MG CAPS, , Disp: , Rfl:    Plecanatide (TRULANCE) 3 MG TABS, Take 1 tablet (3 mg total) by mouth daily., Disp: 30 tablet, Rfl: 5    Objective:    BP 124/84 (BP Location: Right Arm, Patient Position: Sitting, Cuff Size: Normal)   Pulse 87   Temp 98 F (36.7 C) (Temporal)   Ht 5' 7.5" (1.715 m)   Wt 202 lb 6.4 oz (91.8 kg)   SpO2 95%   BMI 31.23 kg/m   BP Readings from Last 3 Encounters:  10/15/23 124/84  08/01/23 112/70  05/08/23 137/74      Physical Exam       Assessment & Plan:  Irritable bowel syndrome with both constipation and diarrhea Assessment & Plan: Discussed fod map diet.     Vitamin D deficiency Assessment & Plan: Ordered vitamin d pending results.     Tobacco abuse Assessment & Plan: Smoking cessation instruction/counseling given:  counseled patient on the dangers of tobacco use, advised patient to stop smoking, and reviewed strategies to maximize success  Pt declines lung cancer screening     Mixed hyperlipidemia  Prediabetes Assessment & Plan: Pt advised of the following: Work on a diabetic diet, try to incorporate exercise at least 20-30 a day for 3 days a week or more.    Orders: -     Comprehensive metabolic panel -     Hemoglobin A1c  Benign essential  hypertension Assessment & Plan: Continue metoprolol 25 mg Xl and lisinopril hctz  Continue f/u prn cardiologist.  Limit/stay away from caffeine for frequent pac's   Orders: -     Lipid panel -     Microalbumin / creatinine urine ratio  Encounter for general adult medical examination without abnormal findings Assessment & Plan: Patient Counseling(The following topics were reviewed):  Preventative care handout given to pt  Health maintenance and immunizations reviewed. Please refer to Health maintenance section. Pt advised on safe sex, wearing seatbelts in car, and proper nutrition labwork ordered today for annual Dental health: Discussed importance of regular tooth brushing, flossing, and dental visits.    Orders: -     Lipid panel -     Comprehensive metabolic panel -     CBC -     Hemoglobin A1c -     Microalbumin / creatinine urine ratio  Generalized anxiety disorder Assessment & Plan: Stable.  Continue lexapro 10 mg once daily.    Gastroesophageal reflux disease without esophagitis Assessment & Plan: Continue pantoprazole 40 mg once daily Try to decrease and or avoid spicy foods, fried fatty foods, and also caffeine and chocolate as these can increase heartburn symptoms.         Follow-up: Return in about 6 months (around 04/16/2024) for f/u cholesterol.   Mort Sawyers, FNP

## 2023-10-15 NOTE — Assessment & Plan Note (Signed)
 Ordered vitamin d pending results.

## 2023-10-15 NOTE — Assessment & Plan Note (Signed)
 Smoking cessation instruction/counseling given:  counseled patient on the dangers of tobacco use, advised patient to stop smoking, and reviewed strategies to maximize success  Pt declines lung cancer screening

## 2023-10-15 NOTE — Assessment & Plan Note (Signed)
 Pt advised of the following: Work on a diabetic diet, try to incorporate exercise at least 20-30 a day for 3 days a week or more.

## 2023-10-15 NOTE — Assessment & Plan Note (Signed)
Stable Continue lexapro 10 mg once daily  

## 2023-10-15 NOTE — Assessment & Plan Note (Signed)
Discussed fodmap diet.

## 2023-10-15 NOTE — Assessment & Plan Note (Signed)
Continue pantoprazole 40 mg once daily Try to decrease and or avoid spicy foods, fried fatty foods, and also caffeine and chocolate as these can increase heartburn symptoms.

## 2023-10-15 NOTE — Assessment & Plan Note (Signed)

## 2023-10-15 NOTE — Assessment & Plan Note (Signed)
Continue metoprolol 25 mg Xl and lisinopril hctz  Continue f/u prn cardiologist.  Limit/stay away from caffeine for frequent pac's

## 2023-10-16 ENCOUNTER — Other Ambulatory Visit: Payer: Self-pay | Admitting: Family

## 2023-10-16 ENCOUNTER — Encounter: Payer: Self-pay | Admitting: Family

## 2023-10-16 DIAGNOSIS — R7989 Other specified abnormal findings of blood chemistry: Secondary | ICD-10-CM

## 2023-11-11 ENCOUNTER — Ambulatory Visit
Admission: RE | Admit: 2023-11-11 | Discharge: 2023-11-11 | Disposition: A | Discharge status: Home / Self Care | Source: Ambulatory Visit | Attending: Family | Admitting: Family

## 2023-11-11 DIAGNOSIS — Z1231 Encounter for screening mammogram for malignant neoplasm of breast: Secondary | ICD-10-CM

## 2023-11-18 ENCOUNTER — Encounter: Payer: Self-pay | Admitting: Family

## 2024-01-10 ENCOUNTER — Encounter: Payer: Self-pay | Admitting: Cardiology

## 2024-01-27 ENCOUNTER — Ambulatory Visit (INDEPENDENT_AMBULATORY_CARE_PROVIDER_SITE_OTHER)

## 2024-01-27 ENCOUNTER — Encounter: Payer: Self-pay | Admitting: Podiatry

## 2024-01-27 ENCOUNTER — Ambulatory Visit (INDEPENDENT_AMBULATORY_CARE_PROVIDER_SITE_OTHER): Admitting: Podiatry

## 2024-01-27 VITALS — BP 154/71 | HR 68 | Temp 98.4°F

## 2024-01-27 DIAGNOSIS — M7671 Peroneal tendinitis, right leg: Secondary | ICD-10-CM | POA: Diagnosis not present

## 2024-01-27 DIAGNOSIS — M779 Enthesopathy, unspecified: Secondary | ICD-10-CM

## 2024-01-27 NOTE — Patient Instructions (Signed)
 VISIT SUMMARY: Today, you were seen for sharp pain on the lateral side of your foot, which has been ongoing since mid-May. The pain worsens with activity, especially on weekends, and sometimes extends towards your ankle. You have been using ice for temporary relief and have no issues with anti-inflammatory medications. Your job requires prolonged standing, but your employer is supportive of your use of a walking boot.  YOUR PLAN: -ACUTE PERONEAL TENOSYNOVITIS: Acute peroneal tenosynovitis is an inflammation of the tendons on the outer side of your ankle. This condition is likely due to increased strain from your higher arches and walking more on the outside of your foot. To manage this, we performed a corticosteroid injection to reduce inflammation and provided you with a walking boot to protect the tendon. You should use ice for pain relief, take NSAIDs like Motrin  for pain management, and apply topical diclofenac gel to control inflammation. Home physical therapy exercises are recommended. Avoid driving with the boot and wear it for at least three to four weeks. We will follow up in six weeks to check your progress, and if there is no improvement, we may consider an MRI to evaluate for any tears.  INSTRUCTIONS: Follow up in six weeks to assess improvement. If there is no improvement, we may consider an MRI to evaluate for partial tearing.                      Contains text generated by [employer].                                 Contains text generated by [employer].   Peroneal Tendinopathy Rehab Ask your health care provider which exercises are safe for you. Do exercises exactly as told by your health care provider and adjust them as directed. It is normal to feel mild stretching, pulling, tightness, or discomfort as you do these exercises. Stop right away if you feel sudden pain or your pain gets worse. Do not begin these exercises until told by your  health care provider. Stretching and range-of-motion exercises These exercises warm up your muscles and joints and improve the movement and flexibility of your ankle. These exercises also help to relieve pain and stiffness. Gastroc and soleus stretch, standing  This is an exercise in which you stand on a step and use your body weight to stretch your calf muscles. To do this exercise: Stand on the edge of a step on the ball of your left / right foot. The ball of your foot is on the walking surface, right under your toes. Keep your other foot firmly on the same step. Hold on to the wall, a railing, or a chair for balance. Slowly lift your other foot, allowing your body weight to press your left / right heel down over the edge of the step. You should feel a stretch in your left / right calf (gastrocnemius and soleus). Hold this position for 15 seconds. Return both feet to the step. Repeat this exercise with a slight bend in your left / right knee. Repeat 5 times with your left / right knee straight and 5 times with your left / right knee bent. Complete this exercise 2 times a day. Strengthening exercises These exercises build strength and endurance in your foot and ankle. Endurance is the ability to use your muscles for a long time, even after they get tired. Ankle dorsiflexion with band  Secure a rubber exercise band or tube to an object, such as a table leg, that will not move when the band is pulled. Secure the other end of the band around your left / right foot. Sit on the floor, facing the object with your left / right leg extended. The band or tube should be slightly tense when your foot is relaxed. Slowly flex your left / right ankle and toes to bring your foot toward you (dorsiflexion). Hold this position for 15 seconds. Let the band or tube slowly pull your foot back to the starting position. Repeat 5 times. Complete this exercise 2 times a day. Ankle eversion Sit on the floor with  your legs straight out in front of you. Loop a rubber exercise band or tube around the ball of your left / right foot. The ball of your foot is on the walking surface, right under your toes. Hold the ends of the band in your hands, or secure the band to a stable object. The band or tube should be slightly tense when your foot is relaxed. Slowly push your foot outward, away from your other leg (eversion). Hold this position for 15 seconds. Slowly return your foot to the starting position. Repeat 5 times. Complete this exercise 2 times a day. Plantar flexion, standing  This exercise is sometimes called standing heel raise. Stand with your feet shoulder-width apart. Place your hands on a wall or table to steady yourself as needed, but try not to use it for support. Keep your weight spread evenly over the width of your feet while you slowly rise up on your toes (plantar flexion). If told by your health care provider: Shift your weight toward your left / right leg until you feel challenged. Stand on your left / right leg only. Hold this position for 15 seconds. Repeat 2 times. Complete this exercise 2 times a day. Single leg stand Without shoes, stand near a railing or in a doorway. You may hold on to the railing or door frame as needed. Stand on your left / right foot. Keep your big toe down on the floor and try to keep your arch lifted. Do not roll to the outside of your foot. If this exercise is too easy, you can try it with your eyes closed or while standing on a pillow. Hold this position for 15 seconds. Repeat 5 times. Complete this exercise 2 times a day. This information is not intended to replace advice given to you by your health care provider. Make sure you discuss any questions you have with your health care provider. Document Revised: 11/11/2018 Document Reviewed: 11/11/2018 Elsevier Patient Education  2020 [employer].

## 2024-01-27 NOTE — Progress Notes (Signed)
 Subjective:  Patient ID: Beth Green, female    DOB: Oct 21, 1965,  MRN: [id number]  Chief Complaint  Patient presents with   Foot Pain    I have lateral foot pain on my right foot.    Discussed the use of AI scribe software for clinical note transcription with the patient, who gave verbal consent to proceed.  History of Present Illness Beth Green is a 58 year old female who presents with lateral foot pain.  She has been experiencing sharp pain on the lateral aspect of her foot since mid-May, which worsens on weekends when she is more active. The pain sometimes extends towards the ankle but does not affect the heel or the underside of the foot. Palpation of the area reveals soreness.  Icing provides temporary relief, but the pain returns once the numbness from the ice wears off. No popping or clicking sensations are present, but she has observed swelling along the tendon during severe pain episodes.  Her occupation as a Haematologist requires prolonged standing, but her employer is accommodating regarding her use of a walking boot. She has no known allergies and tolerates anti-inflammatory medications like Aleve or Motrin  without gastrointestinal issues. She is open to using topical anti-inflammatory gels such as diclofenac or Voltaren gel.  No pain in the retromalleolar groove, heel, or underneath the foot. No popping or clicking sensations in the affected area.      Objective:    Physical Exam VASCULAR: DP and PT pulse palpable. Foot is warm and well-perfused. Capillary fill time is brisk. DERMATOLOGIC: Normal skin turgor, texture, and temperature. No open lesions, rashes, or ulcerations. NEUROLOGIC: Normal sensation to light touch and pressure. No paresthesias on examination. ORTHOPEDIC: Pain and swelling on the lateral ankle and fifth metatarsal base along the peroneus brevis, proximal to insertion. No pain in the retromalleolar groove. 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 Smooth pain-free range of motion of all other examined joints. No ecchymosis or bruising. No gross deformity.       Results Procedure: Corticosteroid Injection Description: Four mg of dexamethasone  and five mg of Marcaine  were injected along the perineal tendon sheath of the peroneus brevis tendon following sterile prep with alcohol. Informed Consent: Counseling about the risks, benefits, and alternatives to the corticosteroid injection was provided. Risks included potential worsening of any microscopic tearing. Benefits included reduction of inflammation. Alternatives included MRI for further evaluation and physical therapy.  RADIOLOGY Ankle X-ray: No fracture, stress fracture, or bony abnormality. Small os perineum. (01/27/2024)   Assessment:   1. Peroneal tendinitis, right      Plan:  Patient was evaluated and treated and all questions answered.  Assessment and Plan Assessment & Plan Acute peroneal tenosynovitis Acute peroneal tenosynovitis with pain and swelling on the lateral ankle and at the fifth metatarsal base along the peroneus brevis, proximal to the insertion. No pain in the retromalleolar groove. Pes cavus foot type with good range of motion of the subtalar joint. Radiographs show no fracture, stress fracture, or bony abnormality, but a small os perineum is present. The condition is likely due to increased strain on the tendon from higher arches and walking more towards the outside of the foot. The condition is expected to improve without surgery unless a tear develops, which is unlikely given the lack of persistent swelling. Discussed the option of a corticosteroid injection to reduce inflammation, with the risk that it could worsen any microscopic tearing. MRI could be considered if  no improvement is seen to evaluate for tears. Ice is recommended for pain relief, and NSAIDs like Motrin  can be used for pain management. Topical diclofenac gel is also  suggested for inflammation control. Advised against driving with the boot and recommended wearing it for at least three to four weeks. - Perform corticosteroid injection with 4 mg dexamethasone  and 5 mg Marcaine  along the peroneal tendon sheath of peroneus brevis tendon. - Dispense a short cam boot to protect the tendon and facilitate healing. - Recommend home physical therapy exercises. - Advise use of ice for pain relief and NSAIDs like Motrin  for pain management. - Suggest topical diclofenac gel for inflammation control. - Follow up in six weeks to assess improvement. - Consider MRI if no improvement is noted to evaluate for partial tearing.      Return in about 6 weeks (around 03/09/2024) for re-check peroneal tendinitis.

## 2024-02-01 ENCOUNTER — Other Ambulatory Visit: Payer: Self-pay | Admitting: Family

## 2024-02-01 DIAGNOSIS — F411 Generalized anxiety disorder: Secondary | ICD-10-CM

## 2024-03-09 ENCOUNTER — Ambulatory Visit (INDEPENDENT_AMBULATORY_CARE_PROVIDER_SITE_OTHER): Admitting: Podiatry

## 2024-03-09 VITALS — Ht 67.5 in | Wt 202.4 lb

## 2024-03-09 DIAGNOSIS — M7671 Peroneal tendinitis, right leg: Secondary | ICD-10-CM | POA: Diagnosis not present

## 2024-03-09 NOTE — Progress Notes (Unsigned)
  Subjective:  Patient ID: Beth Green, female    DOB: 03-23-1966,  MRN: [id number]  Chief Complaint  Patient presents with   Foot Pain    RM 7 Patient is here to follow-up on right foot tendinits. Patient states cold therapy and compression helped with pain. Patient states walking boot has a broken strap, not usable.     SABRA  History of Present Illness Pain has improved overall was probably a 7 or 8 in intensity when this for started and is now 3 or 4 most days.      Objective:    Physical Exam VASCULAR: DP and PT pulse palpable. Foot is warm and well-perfused. Capillary fill time is brisk. DERMATOLOGIC: Normal skin turgor, texture, and temperature. No open lesions, rashes, or ulcerations. NEUROLOGIC: Normal sensation to light touch and pressure. No paresthesias on examination. ORTHOPEDIC: Slight tenderness proximal to the fifth metatarsal base insertion no pain in the retromalleolar groove or distal to the insertion along the peroneal tendon course     Results    RADIOLOGY Ankle X-ray: No fracture, stress fracture, or bony abnormality. Small os perineum. (01/27/2024)   Assessment:   1. Peroneal tendinitis, right       Plan:  Patient was evaluated and treated and all questions answered.  Assessment and Plan Assessment & Plan Acute peroneal tenosynovitis Doing better and has improved quite a bit.  I recommended continued home physical therapy also discussed formal physical therapy which she will continue at home on this for now.  Utilize brace as needed we discussed activities and shoe gear that can exacerbate or improve this.  Follow-up with me in 2 months if still painful would recommend MRI at that point if there has not been improvement or if it worsens before then.  Expected to be a 1 or 2 out of 10 pain by that point and should resolve uneventfully if that is the case.      Return in about 2 months (around 05/09/2024) for re-check peroneal tendinitis.

## 2024-03-29 ENCOUNTER — Other Ambulatory Visit: Payer: Self-pay | Admitting: Family

## 2024-03-29 DIAGNOSIS — I10 Essential (primary) hypertension: Secondary | ICD-10-CM

## 2024-03-29 DIAGNOSIS — I491 Atrial premature depolarization: Secondary | ICD-10-CM

## 2024-03-29 DIAGNOSIS — E782 Mixed hyperlipidemia: Secondary | ICD-10-CM

## 2024-03-29 DIAGNOSIS — I499 Cardiac arrhythmia, unspecified: Secondary | ICD-10-CM

## 2024-05-06 ENCOUNTER — Ambulatory Visit: Admitting: Podiatry

## 2024-05-18 ENCOUNTER — Ambulatory Visit: Admitting: Podiatry

## 2024-07-05 ENCOUNTER — Other Ambulatory Visit: Payer: Self-pay | Admitting: Family

## 2024-07-05 DIAGNOSIS — E782 Mixed hyperlipidemia: Secondary | ICD-10-CM

## 2024-07-05 DIAGNOSIS — F411 Generalized anxiety disorder: Secondary | ICD-10-CM

## 2024-07-05 DIAGNOSIS — I10 Essential (primary) hypertension: Secondary | ICD-10-CM

## 2025-03-16 ENCOUNTER — Encounter: Admitting: Family
